# Patient Record
Sex: Female | Born: 1965 | Race: White | Hispanic: No | Marital: Married | Smoking: Current some day smoker
Health system: Southern US, Community
[De-identification: ages and names within clinical notes are randomized; demographics above are authoritative.]

## PROBLEM LIST (undated history)

## (undated) DIAGNOSIS — I10 Essential (primary) hypertension: Secondary | ICD-10-CM

## (undated) DIAGNOSIS — E119 Type 2 diabetes mellitus without complications: Secondary | ICD-10-CM

## (undated) DIAGNOSIS — M1990 Unspecified osteoarthritis, unspecified site: Secondary | ICD-10-CM

## (undated) DIAGNOSIS — K219 Gastro-esophageal reflux disease without esophagitis: Secondary | ICD-10-CM

## (undated) HISTORY — PX: COLONOSCOPY: SHX174

## (undated) HISTORY — PX: WISDOM TOOTH EXTRACTION: SHX21

## (undated) HISTORY — DX: Type 2 diabetes mellitus without complications: E11.9

## (undated) HISTORY — DX: Gastro-esophageal reflux disease without esophagitis: K21.9

## (undated) HISTORY — DX: Essential (primary) hypertension: I10

## (undated) HISTORY — DX: Unspecified osteoarthritis, unspecified site: M19.90

## (undated) HISTORY — PX: UPPER GASTROINTESTINAL ENDOSCOPY: SHX188

---

## 1997-11-18 ENCOUNTER — Encounter: Admission: RE | Admit: 1997-11-18 | Discharge: 1998-02-16 | Payer: Self-pay | Admitting: Gynecology

## 1998-03-05 ENCOUNTER — Inpatient Hospital Stay (HOSPITAL_COMMUNITY): Admission: AD | Admit: 1998-03-05 | Discharge: 1998-03-08 | Payer: Self-pay | Admitting: Gynecology

## 1998-03-09 ENCOUNTER — Encounter (HOSPITAL_COMMUNITY): Admission: RE | Admit: 1998-03-09 | Discharge: 1998-03-26 | Payer: Self-pay | Admitting: Gynecology

## 1998-04-13 ENCOUNTER — Other Ambulatory Visit: Admission: RE | Admit: 1998-04-13 | Discharge: 1998-04-13 | Payer: Self-pay | Admitting: Gynecology

## 1999-04-30 ENCOUNTER — Other Ambulatory Visit: Admission: RE | Admit: 1999-04-30 | Discharge: 1999-04-30 | Payer: Self-pay | Admitting: Gynecology

## 1999-09-11 ENCOUNTER — Ambulatory Visit (HOSPITAL_COMMUNITY): Admission: RE | Admit: 1999-09-11 | Discharge: 1999-09-11 | Payer: Self-pay | Admitting: Gynecology

## 1999-09-11 ENCOUNTER — Encounter (INDEPENDENT_AMBULATORY_CARE_PROVIDER_SITE_OTHER): Payer: Self-pay

## 2000-02-09 ENCOUNTER — Encounter: Admission: RE | Admit: 2000-02-09 | Discharge: 2000-05-09 | Payer: Self-pay | Admitting: Gynecology

## 2000-08-16 ENCOUNTER — Encounter (INDEPENDENT_AMBULATORY_CARE_PROVIDER_SITE_OTHER): Payer: Self-pay

## 2000-08-16 ENCOUNTER — Inpatient Hospital Stay (HOSPITAL_COMMUNITY): Admission: AD | Admit: 2000-08-16 | Discharge: 2000-08-19 | Payer: Self-pay | Admitting: Gynecology

## 2000-09-27 ENCOUNTER — Other Ambulatory Visit: Admission: RE | Admit: 2000-09-27 | Discharge: 2000-09-27 | Payer: Self-pay | Admitting: Gynecology

## 2001-10-01 ENCOUNTER — Other Ambulatory Visit: Admission: RE | Admit: 2001-10-01 | Discharge: 2001-10-01 | Payer: Self-pay | Admitting: Gynecology

## 2003-01-09 ENCOUNTER — Other Ambulatory Visit: Admission: RE | Admit: 2003-01-09 | Discharge: 2003-01-09 | Payer: Self-pay | Admitting: Gynecology

## 2004-05-19 ENCOUNTER — Other Ambulatory Visit: Admission: RE | Admit: 2004-05-19 | Discharge: 2004-05-19 | Payer: Self-pay | Admitting: Gynecology

## 2004-05-30 HISTORY — PX: ACHILLES TENDON REPAIR: SUR1153

## 2005-01-15 ENCOUNTER — Inpatient Hospital Stay (HOSPITAL_COMMUNITY): Admission: EM | Admit: 2005-01-15 | Discharge: 2005-01-15 | Payer: Self-pay | Admitting: Emergency Medicine

## 2005-06-02 ENCOUNTER — Other Ambulatory Visit: Admission: RE | Admit: 2005-06-02 | Discharge: 2005-06-02 | Payer: Self-pay | Admitting: Gynecology

## 2006-06-07 ENCOUNTER — Other Ambulatory Visit: Admission: RE | Admit: 2006-06-07 | Discharge: 2006-06-07 | Payer: Self-pay | Admitting: Gynecology

## 2007-06-13 ENCOUNTER — Emergency Department (HOSPITAL_COMMUNITY): Admission: EM | Admit: 2007-06-13 | Discharge: 2007-06-13 | Payer: Self-pay | Admitting: Emergency Medicine

## 2008-09-09 ENCOUNTER — Encounter: Payer: Self-pay | Admitting: Gynecology

## 2008-09-09 ENCOUNTER — Other Ambulatory Visit: Admission: RE | Admit: 2008-09-09 | Discharge: 2008-09-09 | Payer: Self-pay | Admitting: Gynecology

## 2008-09-09 ENCOUNTER — Ambulatory Visit: Payer: Self-pay | Admitting: Gynecology

## 2008-09-16 ENCOUNTER — Encounter: Admission: RE | Admit: 2008-09-16 | Discharge: 2008-09-16 | Payer: Self-pay | Admitting: Gynecology

## 2009-07-29 ENCOUNTER — Inpatient Hospital Stay (HOSPITAL_COMMUNITY): Admission: AD | Admit: 2009-07-29 | Discharge: 2009-07-29 | Payer: Self-pay | Admitting: Gynecology

## 2009-07-31 ENCOUNTER — Ambulatory Visit: Payer: Self-pay | Admitting: Gynecology

## 2009-08-07 ENCOUNTER — Ambulatory Visit: Payer: Self-pay | Admitting: Gynecology

## 2009-09-24 ENCOUNTER — Other Ambulatory Visit: Admission: RE | Admit: 2009-09-24 | Discharge: 2009-09-24 | Payer: Self-pay | Admitting: Gynecology

## 2009-09-24 ENCOUNTER — Ambulatory Visit: Payer: Self-pay | Admitting: Gynecology

## 2010-06-23 ENCOUNTER — Encounter (INDEPENDENT_AMBULATORY_CARE_PROVIDER_SITE_OTHER): Payer: Self-pay | Admitting: *Deleted

## 2010-07-01 NOTE — Letter (Signed)
Summary: New Patient letter  [facility]  [address] [place], [place] [address]   Phone: [phone]  Fax: [phone]       06/23/2010 MRN: [id number]  [name] [address] [place], [place]  [address]  Dear Ms. [facility],  Welcome to the Gastroenterology Division at [facility].    You are scheduled to see Dr.  Rob Bunting on July 16, 2010 at 3:15pm on the 3rd floor at [facility], [address]  We ask that you try to arrive at our office 15 minutes prior to your appointment time to allow for check-in.  We would like you to complete the enclosed self-administered evaluation form prior to your visit and bring it with you on the day of your appointment.  We will review it with you.  Also, please bring a complete list of all your medications or, if you prefer, bring the medication bottles and we will list them.  Please bring your insurance card so that we may make a copy of it.  If your insurance requires a referral to see a specialist, please bring your referral form from your primary care physician.  Co-payments are due at the time of your visit and may be paid by cash, check or credit card.     Your office visit will consist of a consult with your physician (includes a physical exam), any laboratory testing he/she may order, scheduling of any necessary diagnostic testing (e.g. x-ray, ultrasound, CT-scan), and scheduling of a procedure (e.g. Endoscopy, Colonoscopy) if required.  Please allow enough time on your schedule to allow for any/all of these possibilities.    If you cannot keep your appointment, please call [phone] to cancel or reschedule prior to your appointment date.  This allows [place] the opportunity to schedule an appointment for another patient in need of care.  If you do not cancel or reschedule by 5 p.m. the business day prior to your appointment date, you will be charged a $50.00 late cancellation/no-show fee.    Thank you for  choosing [facility] for your medical needs.  We appreciate the opportunity to care for you.  Please visit [place] at our website  to learn more about our practice.                     Sincerely,                                                             The Gastroenterology Division

## 2010-07-16 ENCOUNTER — Ambulatory Visit (INDEPENDENT_AMBULATORY_CARE_PROVIDER_SITE_OTHER): Payer: BC Managed Care – PPO | Admitting: Gastroenterology

## 2010-07-16 ENCOUNTER — Other Ambulatory Visit: Payer: Self-pay | Admitting: Gastroenterology

## 2010-07-16 ENCOUNTER — Other Ambulatory Visit: Payer: BC Managed Care – PPO

## 2010-07-16 ENCOUNTER — Encounter: Payer: Self-pay | Admitting: Gastroenterology

## 2010-07-16 ENCOUNTER — Encounter (INDEPENDENT_AMBULATORY_CARE_PROVIDER_SITE_OTHER): Payer: Self-pay | Admitting: *Deleted

## 2010-07-16 DIAGNOSIS — R112 Nausea with vomiting, unspecified: Secondary | ICD-10-CM

## 2010-07-16 DIAGNOSIS — R1013 Epigastric pain: Secondary | ICD-10-CM

## 2010-07-16 DIAGNOSIS — K3189 Other diseases of stomach and duodenum: Secondary | ICD-10-CM

## 2010-07-16 LAB — COMPREHENSIVE METABOLIC PANEL
ALT: 20 U/L (ref 0–35)
AST: 17 U/L (ref 0–37)
BUN: 11 mg/dL (ref 6–23)
Creatinine, Ser: 0.9 mg/dL (ref 0.4–1.2)
Sodium: 140 mEq/L (ref 135–145)
Total Bilirubin: 0.6 mg/dL (ref 0.3–1.2)
Total Protein: 7 g/dL (ref 6.0–8.3)

## 2010-07-21 ENCOUNTER — Telehealth (INDEPENDENT_AMBULATORY_CARE_PROVIDER_SITE_OTHER): Payer: Self-pay | Admitting: *Deleted

## 2010-07-21 NOTE — Letter (Signed)
Summary: EGD Instructions  [facility]  [address] [place], [place] [address]   Phone: [phone]  Fax: [phone]       [name]    [birth year]    MRN: [id number]       Procedure Day /Date:08/11/10 WED     Arrival Time: 230 pm     Procedure Time:330 pm     Location of Procedure:                    [facility] (4th Floor)    PREPARATION FOR ENDOSCOPY   On 08/11/10  THE DAY OF THE PROCEDURE:  1.   No solid foods, milk or milk products are allowed after midnight the night before your procedure.  2.   Do not drink anything colored red or purple.  Avoid juices with pulp.  No orange juice.  3.  You may drink clear liquids until 130 pm, which is 2 hours before your procedure.                                                                                                CLEAR LIQUIDS INCLUDE: Water Jello Ice Popsicles Tea (sugar ok, no milk/cream) Powdered fruit flavored drinks Coffee (sugar ok, no milk/cream) Gatorade Juice: apple, white grape, white cranberry  Lemonade Clear bullion, consomm, broth Carbonated beverages (any kind) Strained chicken noodle soup Hard Candy   MEDICATION INSTRUCTIONS  Unless otherwise instructed, you should take regular prescription medications with a small sip of water as early as possible the morning of your procedure.             OTHER INSTRUCTIONS  You will need a responsible adult at least 43 years of age to accompany you and drive you home.   This person must remain in the waiting room during your procedure.  Wear loose fitting clothing that is easily removed.  Leave jewelry and other valuables at home.  However, you may wish to bring a book to read or an iPod/MP3 player to listen to music as you wait for your procedure to start.  Remove all body piercing jewelry and leave at home.  Total time from sign-in until discharge is approximately 2-3 hours.  You should go home directly after  your procedure and rest.  You can resume normal activities the day after your procedure.  The day of your procedure you should not:   Drive   Make legal decisions   Operate machinery   Drink alcohol   Return to work  You will receive specific instructions about eating, activities and medications before you leave.    The above instructions have been reviewed and explained to me by   _______________________    I fully understand and can verbalize these instructions _____________________________ Date _________

## 2010-07-21 NOTE — Assessment & Plan Note (Addendum)
History of Present Illness Visit Type: Initial Visit Primary GI MD: Rob Bunting MD Primary Provider: n/a Chief Complaint: weight loss & GERD History of Present Illness:        a very pleasant 45 year old woman who is here with her husband today.   she was having GERD symptoma in 1993 (was on propulcid, prevacid, pepcid daily).   and now they are back.  She has a lot of nausea, vomiting.  3-4 times in a week.  usually 20 min after eating.  has been going on for about 4 months.  no pain, symptoms always post prandial.  Feels acid in stomach.    She tried prilosec daily for 14 day, without any difference.    No other meds this.  Currently likes to drink beer at night, late in evening.  this is not new.     no real NSAIDs.  she has lost 23 pounds in past 3 months.             Current Medications (verified): 1)  None  Allergies (verified): No Known Drug Allergies  Past History:  Past Medical History:  none  Past Surgical History:  none  Family History:  no colon cancer, no esophageal cancer  Social History:  she is married, she has one son, she works as a Production assistant, radio at [employer] , she drinks approximately 2-3 beers nightly, she does not smoke cigarettes.  Review of Systems       Pertinent positive and negative review of systems were noted in the above HPI and GI specific review of systems.  All other review of systems was otherwise negative.   Vital Signs:  Patient profile:   45 year old female Height:      68 inches Weight:      189.50 pounds BMI:     28.92 Pulse rate:   72 / minute Pulse rhythm:   regular BP sitting:   118 / 76  (left arm) Cuff size:   regular  Vitals Entered By: June McMurray CMA Duncan Dull) (July 16, 2010 3:26 PM)  Physical Exam  Additional Exam:  Constitutional: generally well appearing Psychiatric: alert and oriented times 3 Eyes: extraocular movements intact Mouth: oropharynx moist, no lesions Neck: supple, no  lymphadenopathy Cardiovascular: heart regular rate and rythm Lungs: CTA bilaterally Abdomen: soft, non-tender, non-distended, no obvious ascites, no peritoneal signs, normal bowel sounds Extremities: no lower extremity edema bilaterally Skin: no lesions on visible extremities    Impression & Recommendations:  Problem # 1:   Nausea, vomiting, dyspepsia  her symptoms may indeed be GERD related however she does not have classic GERD symptoms of heartburn, acid taste in the mouth. She also had no response to proton pump inhibitor for 2 weeks. I think we should proceed with EGD to check for other causes of her symptoms such as gastritis, infection, peptic ulcer disease. She'll get a basic set of labs today CBC and complete metabolic profile. I have given her samples of the prescription proton pump inhibitor which she will take nightly before dinner. If the above workup is not helpful then we will proceed with ultrasound to check  for possible biliary causes.  Other Orders: TLB-CBC Platelet - w/Differential (85025-CBCD) TLB-CMP (Comprehensive Metabolic Pnl) (80053-COMP) T-Pregnancy Test, Urine, Qual ([address])  Patient Instructions: 1)  You will be scheduled to have an upper endoscopy.  May need ultrasound depending on EGD results. 2)  Nexium trial (samples given).  take 1 pill 20-30 min prior dinner  meal. 3)  You will get lab test(s) done today (cbc, cmet, urine preg). 4)  The medication list was reviewed and reconciled.  All changed / newly prescribed medications were explained.  A complete medication list was provided to the patient / caregiver.  Appended Document: Orders Update/EGD nexium samples given   Clinical Lists Changes  Orders: Added new Test order of EGD (EGD) - Signed      Appended Document: Orders Update    Clinical Lists Changes  Orders: Added new Test order of TLB-Preg Serum Quant (B-hCG) (84702-HCG-QN) - Signed

## 2010-07-23 LAB — CONVERTED CEMR LAB
Eosinophils Relative: 1 % (ref 0–5)
Lymphocytes Relative: 30 % (ref 12–46)
Lymphs Abs: 2.2 10*3/uL (ref 0.7–4.0)
Monocytes Absolute: 0.5 10*3/uL (ref 0.1–1.0)
Neutro Abs: 4.4 10*3/uL (ref 1.7–7.7)
Platelets: 263 10*3/uL (ref 150–400)

## 2010-07-27 NOTE — Progress Notes (Signed)
Summary: Lab error  Phone Note Other Incoming   Summary of Call: Dr Christella Hartigan the lab just called and advised me that they failed to have the pt collect a urine sample for the pregnancy test.  They have called her back to have this done. Initial call taken by: Chales Abrahams CMA Duncan Dull),  July 21, 2010 11:08 AM  Follow-up for Phone Call        i would like her to have whichever preg test is fastest...they said it was a 3-5 day turnaround for the urine test previously...that doesn't seem right but if it is, then blood preg test.  she's, we've waited long enough Follow-up by: Rachael Fee MD,  July 21, 2010 11:59 AM  Additional Follow-up for Phone Call Additional follow up Details #1::        Edson Snowball said she can do the blood pregnancy in our lab and run it stat.  The pt is aware and will be in late this afternoon.  The urine test was cancelled Additional Follow-up by: Chales Abrahams CMA Duncan Dull),  July 21, 2010 1:49 PM    Additional Follow-up for Phone Call Additional follow up Details #2::    thanks Follow-up by: Rachael Fee MD,  July 21, 2010 4:32 PM

## 2010-08-04 ENCOUNTER — Encounter (INDEPENDENT_AMBULATORY_CARE_PROVIDER_SITE_OTHER): Payer: Self-pay | Admitting: *Deleted

## 2010-08-04 ENCOUNTER — Other Ambulatory Visit: Payer: BC Managed Care – PPO

## 2010-08-04 ENCOUNTER — Other Ambulatory Visit: Payer: Self-pay | Admitting: Gastroenterology

## 2010-08-04 LAB — PREGNANCY SERUM, QUANT: hCG, Beta Chain, Quant, S: 38815 m[IU]/mL

## 2010-08-11 ENCOUNTER — Ambulatory Visit (INDEPENDENT_AMBULATORY_CARE_PROVIDER_SITE_OTHER): Payer: BC Managed Care – PPO | Admitting: Gynecology

## 2010-08-11 ENCOUNTER — Other Ambulatory Visit: Payer: BC Managed Care – PPO | Admitting: Gastroenterology

## 2010-08-11 DIAGNOSIS — R82998 Other abnormal findings in urine: Secondary | ICD-10-CM

## 2010-08-11 DIAGNOSIS — N912 Amenorrhea, unspecified: Secondary | ICD-10-CM

## 2010-08-16 ENCOUNTER — Ambulatory Visit (INDEPENDENT_AMBULATORY_CARE_PROVIDER_SITE_OTHER): Payer: BC Managed Care – PPO | Admitting: Gynecology

## 2010-08-16 ENCOUNTER — Other Ambulatory Visit: Payer: BC Managed Care – PPO

## 2010-08-16 DIAGNOSIS — N912 Amenorrhea, unspecified: Secondary | ICD-10-CM

## 2010-08-16 DIAGNOSIS — O9989 Other specified diseases and conditions complicating pregnancy, childbirth and the puerperium: Secondary | ICD-10-CM

## 2010-08-20 LAB — CBC
HCT: 39.2 % (ref 36.0–46.0)
Hemoglobin: 13.5 g/dL (ref 12.0–15.0)
MCHC: 34.5 g/dL (ref 30.0–36.0)
MCV: 94.8 fL (ref 78.0–100.0)
Platelets: 252 10*3/uL (ref 150–400)
RDW: 13.3 % (ref 11.5–15.5)

## 2010-08-20 LAB — WET PREP, GENITAL
Trich, Wet Prep: NONE SEEN
Yeast Wet Prep HPF POC: NONE SEEN

## 2010-08-20 LAB — HCG, QUANTITATIVE, PREGNANCY: hCG, Beta Chain, Quant, S: 4766 m[IU]/mL — ABNORMAL HIGH (ref <5)

## 2010-08-20 LAB — POCT PREGNANCY, URINE
Preg Test, Ur: POSITIVE
Preg Test, Ur: POSITIVE

## 2010-09-15 ENCOUNTER — Ambulatory Visit (INDEPENDENT_AMBULATORY_CARE_PROVIDER_SITE_OTHER): Payer: BC Managed Care – PPO | Admitting: Gynecology

## 2010-09-15 DIAGNOSIS — Z3049 Encounter for surveillance of other contraceptives: Secondary | ICD-10-CM

## 2010-10-05 ENCOUNTER — Encounter: Payer: Self-pay | Admitting: Gastroenterology

## 2010-10-06 ENCOUNTER — Ambulatory Visit (AMBULATORY_SURGERY_CENTER): Payer: BC Managed Care – PPO | Admitting: Gastroenterology

## 2010-10-06 ENCOUNTER — Encounter: Payer: Self-pay | Admitting: Gastroenterology

## 2010-10-06 VITALS — BP 135/93 | HR 75 | Temp 97.9°F | Resp 18 | Ht 68.0 in | Wt 180.0 lb

## 2010-10-06 DIAGNOSIS — K219 Gastro-esophageal reflux disease without esophagitis: Secondary | ICD-10-CM

## 2010-10-06 DIAGNOSIS — R112 Nausea with vomiting, unspecified: Secondary | ICD-10-CM

## 2010-10-06 MED ORDER — SODIUM CHLORIDE 0.9 % IV SOLN: 500.0000 mL | INTRAVENOUS | Status: DC

## 2010-10-06 MED ORDER — ESOMEPRAZOLE MAGNESIUM 40 MG PO CPDR: 40.0000 mg | DELAYED_RELEASE_CAPSULE | Freq: Every day | ORAL | Status: DC

## 2010-10-06 NOTE — Patient Instructions (Signed)
Follow discharge instructions.  Continue your medications.  New prescription for Nexium.

## 2010-10-07 ENCOUNTER — Telehealth: Payer: Self-pay | Admitting: *Deleted

## 2010-10-07 NOTE — Telephone Encounter (Signed)

## 2010-10-15 NOTE — Op Note (Signed)
[facility]  Patient:    [name], [name]                       MRN: [id number] Proc. Date: 08/16/00 Adm. Date:  [id number] Attending:  Douglass Rivers                           Operative Report  PREOPERATIVE DIAGNOSES:       1. Previous cesarean section.                               2. ______ diabetes.  POSTOPERATIVE DIAGNOSES:      1. Previous cesarean section.                               2. ______ diabetes.                               3. Pelvic adhesions.  PROCEDURE:                    Elective repeat cesarean section, low flap transverse, with partial omentectomy.  SURGEON:                      Douglass Rivers, M.D.  ASSISTANT:                    Gaetano Hawthorne. Lily Peer, M.D.  ANESTHESIA:                   Spinal.  ESTIMATED BLOOD LOSS:         500 cc.  FINDINGS:                     Viable female infant in the vertex presentation. Clear amniotic fluid.  Loose nuchal cord.  Apgars 9 and 9.  Birth weight 8 lb 1 oz.  Normal uterus, tubes and ovaries.  There were extensive omental adhesions.  PATHOLOGY:                    Omental segment.  COMPLICATIONS:                None.  DESCRIPTION OF PROCEDURE:     The patient was taken to the operating room and placed in the supine position after spinal anesthesia, with left lateral displacement.  She was prepped and draped in the usual sterile fashion.  After adequate anesthesia was ensured, a Pfannenstiel skin incision was made with a scalpel going through the previous cesarean section scar.  The incision was carried through to the underlying layer of fascia with electrocautery.  The fascia was scored in the midline and extended laterally, again with electrocautery.  The fascial incision was grasped with Kochers.  The underlying rectus muscles were dissected off by sharp dissection secondary to extensive adhesion.  The superior aspect of the fascial incision was also grasped with Kochers and the  underlying rectus muscles were dissected off. The rectus muscles were separated in the midline prior to the procedure, but there was an extensive amount of adhesions that were noted.  The abdominal cavity was entered sharply.  A finger was placed in the cavity and there was noted to be extensive omental adhesions to  the anterior abdominal wall.  The incision was carried through with careful attention to the underlying omentum. The pyramidalis muscle was separated, with careful attention to underlying bladder.  The bladder was markedly scarred to the rectus muscle, and this was sharply dissected off.  The omentum was able to be pushed out of the operative field.  The vesicouterine peritoneum was identified.  A bladder blade was inserted.  It was tented up and entered sharply with Metzenbaums.  A bladder flap was created digitally.  The bladder blade was then reinserted and the lower uterine segment was incised n a transverse fashion with a scalpel.  A copious amount of clear amniotic fluid was noted upon entry into the cavity. The vertex was floating and was stabilized, and a vacuum was used to prevent the vertex from converting to breech.  The infant was gently guided through the uterine incision, with careful attention to not extend the incision.  The infant was delivered with the usual maneuvers.  The cord was clamped and cut and the infant was handed off to the waiting pediatricians.  Cord bloods were obtained.  The uterus was massaged and the placenta was allowed to separate naturally.  The uterus was cleared of all clots and debris.  The uterine incision was then repaired with a running locked layer of 0 chromic.  The adnexa were inspected and were noted to be normal.  There was a small paratubal cyst on the left tube.  The pelvis was irrigated with copious amounts of warm saline.  Reinspection of the incision ensured [place] of hemostasis.  The omentum was inspected and initially felt to be  hemostatic, as was the peritoneum and muscles.  We began to close the fascia.  However, it was felt that perhaps there was some bleeding coming from the omentum.  The suture was cut and the omentum was inspected.  Closer inspection of the omentum showed that there were some areas of bleeding as well as two rents which would possibly allow herniation of bowel.  The areas of omentum were then sharply dissected off after being cross clamped with Kelly clamps and free ties of 0 Vicryl were placed.  A small portion of the omentum ended up being excised, and this was passed off the table.  Reinspection of the remainder of the omentum ensured [place] of hemostasis and, again, the peritoneum and muscles were felt to be hemostatic.  The fascia was then closed with 0 Vicryl, starting at the apex and running towards the midline bilaterally.  The subcu was irrigated.  The skin was closed with staples.  The patient tolerated the procedure well.  Sponge, lap, and needle counts were correct x 2.  She was transferred to the PACU in stable condition. DD:  08/16/00 TD:  08/17/00 Job: [id number]

## 2010-10-15 NOTE — Op Note (Signed)
[facility]  Patient:    [name], [name]                       MRN: [id number] Proc. Date: 09/11/99 Adm. Date:  [id number] Attending:  Douglass Rivers                           Operative Report  PREOPERATIVE DIAGNOSIS:       Missed abortion at 9 weeks.  POSTOPERATIVE DIAGNOSIS:      Missed abortion at 9 weeks.  PROCEDURE:                    First trimester dilatation and evacuation.  SURGEON:                      Douglass Rivers, M.D.  ANESTHESIA:                   MAC with paracervical block.  ESTIMATED BLOOD LOSS:         Minimal.  FINDINGS:                     Normal cavity.  PATHOLOGY:                    Uterine contents.  DESCRIPTION OF PROCEDURE:     Patient was taken to the operating room, IV sedation was induced, placed in the dorsal lithotomy position and prepped and draped in he usual sterile fashion.  A bivalved speculum was placed in the vagina and the cervix was visualized and a paracervical block was placed.  The bivalve was removed. Orientation in the uterus was confirmed.  The sterile weighted speculum was placed in the vagina.  The cervix was stabilized with a single-tooth tenaculum and dilated to 27-French.  A #8 suction curette was then advanced through the cervix towards the fundus; suction was obtained.  The uterus was cleared of its contents.  A gentle sharp curettage afterwards confirmed good uterine cry throughout and a normal cavity.  The instruments were removed from the vagina.  The patient tolerated the procedure well.  Sponge, lap and needle counts were correct.  She was given IM Methergine postoperatively and transferred to the PACU in stable condition.DD:  09/11/99 TD:  09/11/99 Job: [id number]

## 2010-10-15 NOTE — Discharge Summary (Signed)
[facility]  Patient:    [name], [name]                       MRN: [id number] Adm. Date:  [id number] Disc. Date: [id number] Attending:  Douglass Rivers Dictator:   Antony Contras, [facility]                           Discharge Summary  DISCHARGE DIAGNOSES:          1. Intrauterine pregnancy at term.                               2. History of previous cesarean section.                               3. Gestational diabetes requiring insulin                                  management.  PROCEDURES:                   Elective repeat left cervical transverse cesarean section with partial omentectomy and delivery of viable infant.  HISTORY OF PRESENT ILLNESS:   The patient is a 45 year old gravida 5, para 1, O, 3, 1, with LMP November 18, 1999, [facility] of August 22, 2000.  Pregnancy was complicated by gestational diabetes requiring insulin management.  The patient also had a history of previous cesarean section and desired repeat.  LABORATORY DATA:              Blood type A positive, AB positive, antibody screen negative, RPR, ______ nonreactive, rubella immune, MSAFP normal.  HOSPITAL COURSE:              Patient was admitted for repeat cesarean section, which was performed by Dr. Farrel Gobble and Dr. Lily Peer under spinal anesthesia.  The patient was delivered of a viable female infant, Apgars 9/9, weighing 8 pounds 1 ounce.  Pathology revealed no abnormal uterus, tubes, ovaries with omental effusions, partial omentectomy was performed. Postoperatively, the patient remained afebrile, had no difficulty voiding, was able to be discharged on her second postoperative day in satisfactory condition.  CBC and hematocrit 33.3, hemoglobin 12, WBCs 14.6, platelets 200.  DISCHARGE DISPOSITION:        Followup in six weeks.  Continue prenatal vitamins and iron, Tylox for pain. DD:  09/11/00 TD:  09/11/00 Job: [id number]

## 2010-10-15 NOTE — Op Note (Signed)
[name], [name] NO.:  [id number]   MEDICAL RECORD NO.:  [id number]          PATIENT TYPE:  INP   LOCATION:  1826                         FACILITY:  [facility]   PHYSICIAN:  Nadara Mustard, MD     DATE OF BIRTH:  1965-10-08   DATE OF PROCEDURE:  01/15/2005  DATE OF DISCHARGE:                                 OPERATIVE REPORT   PREOPERATIVE DIAGNOSIS:  Left Achilles tendon rupture.   POSTOPERATIVE DIAGNOSIS:  Left Achilles tendon rupture.   SURGEON.:  Nadara Mustard, MD   ANESTHESIA:  Popliteal block.   ESTIMATED BLOOD LOSS:  None.   ANTIBIOTICS:  Kefzol 1 g.   TOURNIQUET TIME:  Esmarch at the calf approximately 19 minutes.   DISPOSITION:  To PACU in stable condition. Plan for discharge to home.  Follow-up in the office in 2 weeks, nonweightbearing, prescription for Tylox  and Vicodin.   CONSENT:  The risks and benefits of surgery were discussed with the patient  and her husband including infection, neurovascular injury, persistent pain,  weakness free rupture of the tendon, nonhealing wound, and need for  additional surgery. The patient and her husband been state they understand  and wished to proceed at this time.   INDICATIONS FOR PROCEDURE:  The patient is a 45 year old woman who was  playing volleyball.  She went to push off her left foot, she felt like  somebody had kicked her in the back of the ankle. She was evaluated in the  emergency room and she was seen for evaluation and consultation for  evaluation of her Achilles tendon rupture. The patient's past medical  history is essentially unremarkable. No medications. No allergies.  Review  of systems was negative for any pulmonary or cardiac problems. Negative  history of diabetes. She states she did have gestational diabetes.   On examination the patient had good dorsalis pedis pulse. Her foot was  neurovascularly intact; it was a closed injury.  She had a palpable defect  of the Achilles.   Compression the calf did not reproduce plantar flexion.   ASSESSMENT:  Achilles tendon rupture.   PLAN:  The patient is scheduled for surgical intervention at this time,  after reviewing the risks and benefits.   DESCRIPTION OF PROCEDURE:  The patient was brought to the OR 5 after  undergoing a popliteal block. After adequate level of anesthesia obtained,  the patient's left lower extremity was prepped using DuraPrep, draped in a  sterile field. Wyban was used to cover all exposed skin. She was placed in  the prone position. An  Esmarch was wrapped around the ankle for tourniquet  control. A posterior medial incision was made; this was carried down sharply  through the peritenon. The patient had complete rupture of mid substance  closer to the musculotendinous junction of the Achilles. Using 4 of the #2  fiber wire sutures, these were passed 2 of the sutures proximally and 2  sutures distally using a Krakower suture technique. This left 4 sutures from  each of the proximal and distal stumps. The rupture was reduced and  the  sutures were tied in mid substance. The patient had a good range of motion  after repair. The paratenon was closed using 3-0 Monocryl.  The skin was  closed using a Algower suture using 3-0 nylon. The wound was covered with  Adaptic orthopedic sponges, Webril and a Coban dressing. The patient's foot  was left in plantar flexion.  She will be discharged to home,  nonweightbearing; follow-up in the office in 2 weeks for placement of the  fracture boot to begin weightbearing.      Nadara Mustard, MD  Electronically Signed     MVD/MEDQ  D:  01/15/2005  T:  01/16/2005  Job:  [id number]

## 2013-05-02 ENCOUNTER — Ambulatory Visit (INDEPENDENT_AMBULATORY_CARE_PROVIDER_SITE_OTHER): Payer: BC Managed Care – PPO | Admitting: Gynecology

## 2013-05-02 ENCOUNTER — Encounter: Payer: Self-pay | Admitting: Gynecology

## 2013-05-02 ENCOUNTER — Other Ambulatory Visit (HOSPITAL_COMMUNITY)
Admission: RE | Admit: 2013-05-02 | Discharge: 2013-05-02 | Disposition: A | Discharge status: Home / Self Care | Payer: BC Managed Care – PPO | Source: Ambulatory Visit | Attending: Gynecology | Admitting: Gynecology

## 2013-05-02 ENCOUNTER — Other Ambulatory Visit: Payer: Self-pay

## 2013-05-02 VITALS — BP 132/86 | Ht 67.75 in | Wt 198.6 lb

## 2013-05-02 DIAGNOSIS — Z8632 Personal history of gestational diabetes: Secondary | ICD-10-CM

## 2013-05-02 DIAGNOSIS — Z1151 Encounter for screening for human papillomavirus (HPV): Secondary | ICD-10-CM | POA: Insufficient documentation

## 2013-05-02 DIAGNOSIS — Z01419 Encounter for gynecological examination (general) (routine) without abnormal findings: Secondary | ICD-10-CM | POA: Insufficient documentation

## 2013-05-02 DIAGNOSIS — R87810 Cervical high risk human papillomavirus (HPV) DNA test positive: Secondary | ICD-10-CM | POA: Insufficient documentation

## 2013-05-02 DIAGNOSIS — R635 Abnormal weight gain: Secondary | ICD-10-CM

## 2013-05-02 DIAGNOSIS — G4700 Insomnia, unspecified: Secondary | ICD-10-CM

## 2013-05-02 DIAGNOSIS — Z1231 Encounter for screening mammogram for malignant neoplasm of breast: Secondary | ICD-10-CM

## 2013-05-02 LAB — COMPREHENSIVE METABOLIC PANEL
Alkaline Phosphatase: 55 U/L (ref 39–117)
BUN: 9 mg/dL (ref 6–23)
CO2: 28 mEq/L (ref 19–32)
Glucose, Bld: 88 mg/dL (ref 70–99)
Total Bilirubin: 0.5 mg/dL (ref 0.3–1.2)

## 2013-05-02 LAB — CBC WITH DIFFERENTIAL/PLATELET
Basophils Absolute: 0 10*3/uL (ref 0.0–0.1)
Basophils Relative: 1 % (ref 0–1)
Eosinophils Absolute: 0.1 10*3/uL (ref 0.0–0.7)
Eosinophils Relative: 3 % (ref 0–5)
Lymphocytes Relative: 31 % (ref 12–46)
MCH: 32 pg (ref 26.0–34.0)
MCHC: 35.6 g/dL (ref 30.0–36.0)
Monocytes Absolute: 0.4 10*3/uL (ref 0.1–1.0)
Monocytes Relative: 7 % (ref 3–12)
RBC: 4.94 MIL/uL (ref 3.87–5.11)
RDW: 12.9 % (ref 11.5–15.5)
WBC: 4.9 10*3/uL (ref 4.0–10.5)

## 2013-05-02 LAB — LIPID PANEL
Cholesterol: 166 mg/dL (ref 0–200)
HDL: 58 mg/dL (ref >39)
LDL Cholesterol: 89 mg/dL (ref 0–99)
Triglycerides: 95 mg/dL (ref <150)

## 2013-05-02 LAB — TSH: TSH: 1.715 u[IU]/mL (ref 0.350–4.500)

## 2013-05-02 NOTE — Patient Instructions (Signed)

## 2013-05-02 NOTE — Progress Notes (Addendum)
[name] [birth year] [id number]   History:    47 y.o.  for annual gyn exam who has not been seen in the office since 2011.she reports normal menstrual cycles and is using condoms for contraception. 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 Patient does have history of gestational diabetes with her pregnancy in the past. She has not had a mammogram since April 2010 but she does do her monthly breast exam. Her gastroenterologist had been evaluating her in 2012 for reflux and had a normal EGD and colonoscopy according to patient. She is feeling better now. Patient refused the flu vaccine.  Past medical history,surgical history, family history and social history were all reviewed and documented in the EPIC chart.  Gynecologic History Patient's last menstrual period was 04/25/2013. Contraception: condoms Last Pap: 2011. Results were: normal Last mammogram: 2010. Results were: normal but dense  Obstetric History OB History  Gravida Para Term Preterm AB SAB TAB Ectopic Multiple Living  6 2   2 2    2     # Outcome Date GA Lbr Len/2nd Weight Sex Delivery Anes PTL Lv  6 SAB           5 SAB           4 PAR           3 PAR           2 GRA           1 GRA                ROS: A ROS was performed and pertinent positives and negatives are included in the history.  GENERAL: No fevers or chills. HEENT: No change in vision, no earache, sore throat or sinus congestion. NECK: No pain or stiffness. CARDIOVASCULAR: No chest pain or pressure. No palpitations. PULMONARY: No shortness of breath, cough or wheeze. GASTROINTESTINAL: No abdominal pain, nausea, vomiting or diarrhea, melena or bright red blood per rectum. GENITOURINARY: No urinary frequency, urgency, hesitancy or dysuria. MUSCULOSKELETAL: No joint or muscle pain, no back pain, no recent trauma. DERMATOLOGIC: No  rash, no itching, no lesions. ENDOCRINE: No polyuria, polydipsia, no heat or cold intolerance. No recent change in weight. HEMATOLOGICAL: No anemia or easy bruising or bleeding. NEUROLOGIC: No headache, seizures, numbness, tingling or weakness. PSYCHIATRIC: No depression, no loss of interest in normal activity or change in sleep pattern.     Exam: chaperone present  BP 132/86  Ht 5' 7.75" (1.721 m)  Wt 198 lb 9.6 oz (90.084 kg)  BMI 30.41 kg/m2  LMP 04/25/2013  Body mass index is 30.41 kg/(m^2).  General appearance : Well developed well nourished female. No acute distress HEENT: Neck supple, trachea midline, no carotid bruits, no thyroidmegaly Lungs: Clear to auscultation, no rhonchi or wheezes, or rib retractions  Heart: Regular rate and rhythm, no murmurs or gallops Breast:Examined in sitting and supine position were symmetrical in appearance, no palpable masses or tenderness,  no skin retraction, no nipple inversion, no nipple discharge, no skin discoloration, no axillary or supraclavicular lymphadenopathy Abdomen: no palpable masses or tenderness, no rebound or guarding Extremities: no edema or skin discoloration or tenderness  Pelvic:  Bartholin, Urethra, Skene Glands: Within normal limits             Vagina: No gross lesions or discharge  Cervix: No  gross lesions or discharge  Uterus  anteverted, normal size, shape and consistency, non-tender and mobile  Adnexa  Without masses or tenderness  Anus and perineum  normal   Rectovaginal  normal sphincter tone without palpated masses or tenderness             Hemoccult not indicated     Assessment/Plan:  48 y.o. female for annual exam long overdue. Pap smear with HPV screen was done today. She was given a requisition to schedule her mammogram. She was reminded her monthly breast exam. We discussed importance of calcium and vitamin D in regular exercise for osteoporosis prevention. Contraceptive options were discussed and she would  like to continue on a condom. The following labs were ordered: TSH, fasting lipid profile, comprehensive metabolic panel, CBC, and urinalysis.  Note: This dictation was prepared with  Dragon/digital dictation along withSmart phrase technology. Any transcriptional errors that result from this process are unintentional.   Ok Edwards MD, 12:39 PM 05/02/2013

## 2013-05-03 LAB — URINALYSIS W MICROSCOPIC + REFLEX CULTURE
Bilirubin Urine: NEGATIVE
Casts: NONE SEEN
Crystals: NONE SEEN
Glucose, UA: NEGATIVE mg/dL
Ketones, ur: NEGATIVE mg/dL
Specific Gravity, Urine: 1.023 (ref 1.005–1.030)
Urobilinogen, UA: 0.2 mg/dL (ref 0.0–1.0)
pH: 8 (ref 5.0–8.0)

## 2013-05-20 ENCOUNTER — Telehealth: Payer: Self-pay

## 2013-05-20 ENCOUNTER — Encounter: Payer: Self-pay | Admitting: Gynecology

## 2013-05-20 NOTE — Telephone Encounter (Signed)
Left message labs were fine and that is why we have not called her. No follow up needed.  She had said she did not have notification number to be able toget on My Chart so I let her know that I had put an AVS with a sheet explaining My Chart that includes her notification number into the mail for her.

## 2013-05-20 NOTE — Telephone Encounter (Signed)
Patient called to check lab results from 05/02/13.  I noticed the info regarding HPV testing on bottom of pap smear. Do I need to tell her anything about that?

## 2013-05-20 NOTE — Telephone Encounter (Signed)
Her Pap smear was normal but the HPV screen was positive. When we had Demerol in the HPV screen the subtypes that are responsible for cervical cancer was negative. We do not need to do anything differently than followup with a Pap smear in one year.

## 2013-06-10 ENCOUNTER — Ambulatory Visit
Admission: RE | Admit: 2013-06-10 | Discharge: 2013-06-10 | Disposition: A | Discharge status: Home / Self Care | Payer: BC Managed Care – PPO | Source: Ambulatory Visit

## 2013-06-10 DIAGNOSIS — Z1231 Encounter for screening mammogram for malignant neoplasm of breast: Secondary | ICD-10-CM

## 2013-11-13 ENCOUNTER — Telehealth: Payer: Self-pay | Admitting: *Deleted

## 2013-11-13 NOTE — Telephone Encounter (Signed)
Pt left message in voicemail requesting referral for GI MD, I left on pt voicemail that OV is needed before we can send her to GI MD, the provider would have to determine if GI. MD needed.

## 2014-03-31 ENCOUNTER — Encounter: Payer: Self-pay | Admitting: Gynecology

## 2014-05-20 ENCOUNTER — Encounter: Payer: BC Managed Care – PPO | Admitting: Gynecology

## 2014-06-02 ENCOUNTER — Ambulatory Visit (INDEPENDENT_AMBULATORY_CARE_PROVIDER_SITE_OTHER): Payer: BC Managed Care – PPO | Admitting: Gynecology

## 2014-06-02 ENCOUNTER — Encounter: Payer: Self-pay | Admitting: Gynecology

## 2014-06-02 VITALS — BP 136/88 | Ht 67.75 in | Wt 203.0 lb

## 2014-06-02 DIAGNOSIS — Z01419 Encounter for gynecological examination (general) (routine) without abnormal findings: Secondary | ICD-10-CM

## 2014-06-02 DIAGNOSIS — Z23 Encounter for immunization: Secondary | ICD-10-CM

## 2014-06-02 DIAGNOSIS — R140 Abdominal distension (gaseous): Secondary | ICD-10-CM

## 2014-06-02 DIAGNOSIS — K5901 Slow transit constipation: Secondary | ICD-10-CM

## 2014-06-02 MED ORDER — LINACLOTIDE 145 MCG PO CAPS: 145.0000 ug | ORAL_CAPSULE | Freq: Every day | ORAL | Status: DC

## 2014-06-02 NOTE — Progress Notes (Signed)
[name] April 25, 1966 [id number]   History:    49 y.o.  who presented to the office for her annual exam but has been complaining of feeling bloated and constipated whereby she goes every 3-4 days. She had seen a primary care physician few months ago and had prescribed her MiraLAX but she does not take it on a regular basis. She denies any blood in her stools or any family history colorectal cancer. Patient reports having normal menstrual cycles and currently using condoms for contraception. 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 Her gastroenterologist had been evaluating her in 2012 for reflux and had a normal EGD and colonoscopy according to patient. Patient had declined the flu vaccine this year. Patient to receive the T that vaccine today.   Past medical history,surgical history, family history and social history were all reviewed and documented in the EPIC chart.  Gynecologic History Patient's last menstrual period was 05/18/2014. Contraception: condoms Last Pap: 2014. Results were: normal Last mammogram: 2015. Results were: Dense breasts had three-dimensional mammogram  Obstetric History OB History  Gravida Para Term Preterm AB SAB TAB Ectopic Multiple Living  6 2   2 2    2     # Outcome Date GA Lbr Len/2nd Weight Sex Delivery Anes PTL Lv  6 SAB           5 SAB           4 Para           3 Para           2 Gravida           1 Gravida                ROS: A ROS was performed and pertinent positives and negatives are included in the history.  GENERAL: No fevers or chills. HEENT: No change in vision, no earache, sore throat or sinus congestion. NECK: No pain or stiffness. CARDIOVASCULAR: No chest pain or pressure. No palpitations. PULMONARY: No shortness of breath, cough or wheeze.  GASTROINTESTINAL: No abdominal pain, nausea, vomiting or diarrhea, melena or bright red blood per rectum. GENITOURINARY: No urinary frequency, urgency, hesitancy or dysuria. MUSCULOSKELETAL: No joint or muscle pain, no back pain, no recent trauma. DERMATOLOGIC: No rash, no itching, no lesions. ENDOCRINE: No polyuria, polydipsia, no heat or cold intolerance. No recent change in weight. HEMATOLOGICAL: No anemia or easy bruising or bleeding. NEUROLOGIC: No headache, seizures, numbness, tingling or weakness. PSYCHIATRIC: No depression, no loss of interest in normal activity or change in sleep pattern.     Exam: chaperone present  BP 136/88 mmHg  Ht 5' 7.75" (1.721 m)  Wt 203 lb (92.08 kg)  BMI 31.09 kg/m2  LMP 05/18/2014  Body mass index is 31.09 kg/(m^2).  General appearance : Well developed well nourished female. No acute distress HEENT: Neck supple, trachea midline, no carotid bruits, no thyroidmegaly Lungs: Clear to auscultation, no rhonchi or wheezes, or rib retractions  Heart: Regular rate and rhythm, no murmurs or gallops Breast:Examined in sitting and supine position were symmetrical in appearance, no palpable masses or tenderness,  no skin retraction, no nipple inversion, no nipple discharge, no skin discoloration, no axillary or supraclavicular lymphadenopathy Abdomen: no palpable masses or tenderness, no rebound or guarding Extremities: no edema or skin  discoloration or tenderness  Pelvic:  Bartholin, Urethra, Skene Glands: Within normal limits             Vagina: No gross lesions or discharge  Cervix: No gross lesions or discharge  Uterus  anteverted, normal size, shape and consistency, non-tender and mobile  Adnexa  Without masses or tenderness  Anus and perineum  normal   Rectovaginal  normal sphincter tone without palpated masses or tenderness             Hemoccult not given     Assessment/Plan:  49 y.o. female for annual exam with chronic constipation and bloating. She  is going to return back to the office next week for pelvic ultrasound for better assessment of her ovaries. Patient has gaining 5 pounds is last year currently weighing 203 pounds. She is going to be prescribed Linzess 145 g capsule daily to help with her constipation. Literature information was provided. We discussed importance of regular exercise as well as increasing fluid intake and high fiber diet. The following labs ordered today: Fasting lipid profile, TSH, comprehensive metabolic panel, CBC and urinalysis. Pap smear was not done today in accordance to the new guidelines. We discussed importance of monthly breast exam. Patient to receive the Tdap vaccine today.   Terrance Mass MD, 2:49 PM 06/02/2014

## 2014-06-02 NOTE — Addendum Note (Signed)
Addended by: Thurnell Garbe A on: 06/02/2014 02:58 PM   Modules accepted: Orders, SmartSet

## 2014-06-02 NOTE — Patient Instructions (Addendum)
Tdap Vaccine (Tetanus, Diphtheria, Pertussis): What You Need to Know 1. Why get vaccinated? Tetanus, diphtheria and pertussis can be very serious diseases, even for adolescents and adults. Tdap vaccine can protect us from these diseases. TETANUS (Lockjaw) causes painful muscle tightening and stiffness, usually all over the body.  It can lead to tightening of muscles in the head and neck so you can't open your mouth, swallow, or sometimes even breathe. Tetanus kills about 1 out of 5 people who are infected. DIPHTHERIA can cause a thick coating to form in the back of the throat.  It can lead to breathing problems, paralysis, heart failure, and death. PERTUSSIS (Whooping Cough) causes severe coughing spells, which can cause difficulty breathing, vomiting and disturbed sleep.  It can also lead to weight loss, incontinence, and rib fractures. Up to 2 in 100 adolescents and 5 in 100 adults with pertussis are hospitalized or have complications, which could include pneumonia or death. These diseases are caused by bacteria. Diphtheria and pertussis are spread from person to person through coughing or sneezing. Tetanus enters the body through cuts, scratches, or wounds. Before vaccines, the [place] saw as many as 200,000 cases a year of diphtheria and pertussis, and hundreds of cases of tetanus. Since vaccination began, tetanus and diphtheria have dropped by about 99% and pertussis by about 80%. 2. Tdap vaccine Tdap vaccine can protect adolescents and adults from tetanus, diphtheria, and pertussis. One dose of Tdap is routinely given at age 11 or 12. People who did not get Tdap at that age should get it as soon as possible. Tdap is especially important for health care professionals and anyone having close contact with a baby younger than 12 months. Pregnant women should get a dose of Tdap during every pregnancy, to protect the newborn from pertussis. Infants are most at risk for severe, life-threatening  complications from pertussis. A similar vaccine, called Td, protects from tetanus and diphtheria, but not pertussis. A Td booster should be given every 10 years. Tdap may be given as one of these boosters if you have not already gotten a dose. Tdap may also be given after a severe cut or burn to prevent tetanus infection. Your doctor can give you more information. Tdap may safely be given at the same time as other vaccines. 3. Some people should not get this vaccine  If you ever had a life-threatening allergic reaction after a dose of any tetanus, diphtheria, or pertussis containing vaccine, OR if you have a severe allergy to any part of this vaccine, you should not get Tdap. Tell your doctor if you have any severe allergies.  If you had a coma, or long or multiple seizures within 7 days after a childhood dose of DTP or DTaP, you should not get Tdap, unless a cause other than the vaccine was found. You can still get Td.  Talk to your doctor if you:  have epilepsy or another nervous system problem,  had severe pain or swelling after any vaccine containing diphtheria, tetanus or pertussis,  ever had Guillain-Barr Syndrome (GBS),  aren't feeling well on the day the shot is scheduled. 4. Risks of a vaccine reaction With any medicine, including vaccines, there is a chance of side effects. These are usually mild and go away on their own, but serious reactions are also possible. Brief fainting spells can follow a vaccination, leading to injuries from falling. Sitting or lying down for about 15 minutes can help prevent these. Tell your doctor if you feel   dizzy or light-headed, or have vision changes or ringing in the ears. Mild problems following Tdap (Did not interfere with activities)  Pain where the shot was given (about 3 in 4 adolescents or 2 in 3 adults)  Redness or swelling where the shot was given (about 1 person in 5)  Mild fever of at least 100.12F (up to about 1 in 25 adolescents or  1 in 100 adults)  Headache (about 3 or 4 people in 10)  Tiredness (about 1 person in 3 or 4)  Nausea, vomiting, diarrhea, stomach ache (up to 1 in 4 adolescents or 1 in 10 adults)  Chills, body aches, sore joints, rash, swollen glands (uncommon) Moderate problems following Tdap (Interfered with activities, but did not require medical attention)  Pain where the shot was given (about 1 in 5 adolescents or 1 in 100 adults)  Redness or swelling where the shot was given (up to about 1 in 16 adolescents or 1 in 25 adults)  Fever over 102F (about 1 in 100 adolescents or 1 in 250 adults)  Headache (about 3 in 20 adolescents or 1 in 10 adults)  Nausea, vomiting, diarrhea, stomach ache (up to 1 or 3 people in 100)  Swelling of the entire arm where the shot was given (up to about 3 in 100). Severe problems following Tdap (Unable to perform usual activities; required medical attention)  Swelling, severe pain, bleeding and redness in the arm where the shot was given (rare). A severe allergic reaction could occur after any vaccine (estimated less than 1 in a million doses). 5. What if there is a serious reaction? What should I look for?  Look for anything that concerns you, such as signs of a severe allergic reaction, very high fever, or behavior changes. Signs of a severe allergic reaction can include hives, swelling of the face and throat, difficulty breathing, a fast heartbeat, dizziness, and weakness. These would start a few minutes to a few hours after the vaccination. What should I do?  If you think it is a severe allergic reaction or other emergency that can't wait, call 9-1-1 or get the person to the nearest hospital. Otherwise, call your doctor.  Afterward, the reaction should be reported to the "Vaccine Adverse Event Reporting System" (VAERS). Your doctor might file this report, or you can do it yourself through the VAERS web site at www.vaers.SamedayNews.es, or by calling  (864)128-1521. VAERS is only for reporting reactions. They do not give medical advice.  6. The National Vaccine Injury Compensation Program The [employer] Vaccine Injury Compensation Program (VICP) is a federal program that was created to compensate people who may have been injured by certain vaccines. Persons who believe they may have been injured by a vaccine can learn about the program and about filing a claim by calling 301-200-3545 or visiting the [place] website at GoldCloset.com.ee. 7. How can I learn more?  Ask your doctor.  Call your local or state health department.  Contact the Centers for Disease Control and Prevention (CDC):  Call (640)321-8776 or visit CDC's website at http://hunter.com/. CDC Tdap Vaccine VIS (10/06/11) Document Released: 11/15/2011 Document Revised: 09/30/2013 Document Reviewed: 08/28/2013 ExitCare Patient Information 2015 [place], [place]. This information is not intended to replace advice given to you by your health care provider. Make sure you discuss any questions you have with your health care provider. Linaclotide oral capsules What is this medicine? LINACLOTIDE (lin a KLOE tide) is used to treat irritable bowel syndrome (IBS) with constipation as the main  problem. It may also be used for relief of chronic constipation. This medicine may be used for other purposes; ask your health care provider or pharmacist if you have questions. COMMON BRAND NAME(S): Linzess What should I tell my health care provider before I take this medicine? They need to know if you have any of these conditions: -history of stool (fecal) impaction -now have diarrhea or have diarrhea often -other medical condition -stomach or intestinal disease, including bowel obstruction or abdominal adhesions -an unusual or allergic reaction to linaclotide, other medicines, foods, dyes, or preservatives -pregnant or trying to get pregnant -breast-feeding How should I use this  medicine? Take this medicine by mouth with a glass of water. Follow the directions on the prescription label. Do not cut, crush or chew this medicine. Take on an empty stomach, at least 30 minutes before your first meal of the day. Take your medicine at regular intervals. Do not take your medicine more often than directed. Do not stop taking except on your doctor's advice. A special MedGuide will be given to you by the pharmacist with each prescription and refill. Be sure to read this information carefully each time. Talk to your pediatrician regarding the use of this medicine in children. This medicine is not approved for use in children. Overdosage: If you think you've taken too much of this medicine contact a poison control center or emergency room at once. Overdosage: If you think you have taken too much of this medicine contact a poison control center or emergency room at once. NOTE: This medicine is only for you. Do not share this medicine with others. What if I miss a dose? If you miss a dose, just skip that dose. Wait until your next dose, and take only that dose. Do not take double or extra doses. What may interact with this medicine? -certain medicines for bowel problems or bladder incontinence (these can cause constipation) This list may not describe all possible interactions. Give your health care provider a list of all the medicines, herbs, non-prescription drugs, or dietary supplements you use. Also tell them if you smoke, drink alcohol, or use illegal drugs. Some items may interact with your medicine. What should I watch for while using this medicine? Visit your doctor for regular check ups. Tell your doctor if your symptoms do not get better or if they get worse. Diarrhea is a common side effect of this medicine. It often begins within 2 weeks of starting this medicine. Stop taking this medicine and call your doctor if you get severe diarrhea. Stop taking this medicine and call your  doctor or go to the nearest hospital emergency room right away if you develop unusual or severe stomach-area (abdominal) pain, especially if you also have bright red, bloody stools or black stools that look like tar. What side effects may I notice from receiving this medicine? Side effects that you should report to your doctor or health care professional as soon as possible: -allergic reactions like skin rash, itching or hives, swelling of the face, lips, or tongue -black, tarry stools -bloody or watery diarrhea -new or worsening stomach pain -severe or prolonged diarrhea Side effects that usually do not require medical attention (Report these to your doctor or health care professional if they continue or are bothersome.): -bloating -gas -loose stools This list may not describe all possible side effects. Call your doctor for medical advice about side effects. You may report side effects to FDA at 1-800-FDA-1088. Where should I keep my medicine?  Keep out of the reach of children. Store at room temperature between 20 and 25 degrees C (68 and 77 degrees F). Keep this medicine in the original container. Keep tightly closed in a dry place. Do not remove the desiccant packet from the bottle, it helps to protect your medicine from moisture. Throw away any unused medicine after the expiration date. NOTE: This sheet is a summary. It may not cover all possible information. If you have questions about this medicine, talk to your doctor, pharmacist, or health care provider.  2015, [employer]. (2011-02-01 13:05:27)

## 2014-06-03 LAB — CBC WITH DIFFERENTIAL/PLATELET
BASOS PCT: 1 % (ref 0–1)
Basophils Absolute: 0.1 10*3/uL (ref 0.0–0.1)
Eosinophils Absolute: 0.1 10*3/uL (ref 0.0–0.7)
Eosinophils Relative: 2 % (ref 0–5)
HEMATOCRIT: 44.1 % (ref 36.0–46.0)
HEMOGLOBIN: 15 g/dL (ref 12.0–15.0)
Lymphocytes Relative: 33 % (ref 12–46)
Lymphs Abs: 2 10*3/uL (ref 0.7–4.0)
MCH: 31.8 pg (ref 26.0–34.0)
MCHC: 34 g/dL (ref 30.0–36.0)
MCV: 93.4 fL (ref 78.0–100.0)
MPV: 10.2 fL (ref 8.6–12.4)
Monocytes Absolute: 0.4 10*3/uL (ref 0.1–1.0)
Monocytes Relative: 7 % (ref 3–12)
NEUTROS ABS: 3.5 10*3/uL (ref 1.7–7.7)
NEUTROS PCT: 57 % (ref 43–77)
Platelets: 257 10*3/uL (ref 150–400)
RBC: 4.72 MIL/uL (ref 3.87–5.11)
RDW: 13.1 % (ref 11.5–15.5)
WBC: 6.1 10*3/uL (ref 4.0–10.5)

## 2014-06-03 LAB — COMPREHENSIVE METABOLIC PANEL
ALBUMIN: 4.1 g/dL (ref 3.5–5.2)
ALK PHOS: 57 U/L (ref 39–117)
ALT: 17 U/L (ref 0–35)
AST: 15 U/L (ref 0–37)
BUN: 10 mg/dL (ref 6–23)
CO2: 28 mEq/L (ref 19–32)
Calcium: 9.2 mg/dL (ref 8.4–10.5)
Chloride: 103 mEq/L (ref 96–112)
Creat: 0.71 mg/dL (ref 0.50–1.10)
GLUCOSE: 85 mg/dL (ref 70–99)
Potassium: 3.8 mEq/L (ref 3.5–5.3)
Sodium: 139 mEq/L (ref 135–145)
Total Bilirubin: 0.4 mg/dL (ref 0.2–1.2)
Total Protein: 6.7 g/dL (ref 6.0–8.3)

## 2014-06-03 LAB — LIPID PANEL
CHOL/HDL RATIO: 2.6 ratio
Cholesterol: 149 mg/dL (ref 0–200)
HDL: 58 mg/dL (ref >39)
LDL Cholesterol: 79 mg/dL (ref 0–99)
Triglycerides: 61 mg/dL (ref <150)
VLDL: 12 mg/dL (ref 0–40)

## 2014-06-03 LAB — URINALYSIS W MICROSCOPIC + REFLEX CULTURE
Bilirubin Urine: NEGATIVE
CRYSTALS: NONE SEEN
Casts: NONE SEEN
Glucose, UA: NEGATIVE mg/dL
Hgb urine dipstick: NEGATIVE
Ketones, ur: NEGATIVE mg/dL
Leukocytes, UA: NEGATIVE
NITRITE: NEGATIVE
PROTEIN: NEGATIVE mg/dL
Specific Gravity, Urine: 1.028 (ref 1.005–1.030)
Urobilinogen, UA: 0.2 mg/dL (ref 0.0–1.0)
pH: 7 (ref 5.0–8.0)

## 2014-06-03 LAB — TSH: TSH: 1.888 u[IU]/mL (ref 0.350–4.500)

## 2014-06-04 LAB — URINE CULTURE: Colony Count: 8000

## 2014-06-16 ENCOUNTER — Ambulatory Visit (INDEPENDENT_AMBULATORY_CARE_PROVIDER_SITE_OTHER): Payer: BLUE CROSS/BLUE SHIELD

## 2014-06-16 ENCOUNTER — Other Ambulatory Visit: Payer: Self-pay | Admitting: Gynecology

## 2014-06-16 ENCOUNTER — Encounter: Payer: Self-pay | Admitting: Gynecology

## 2014-06-16 ENCOUNTER — Ambulatory Visit (INDEPENDENT_AMBULATORY_CARE_PROVIDER_SITE_OTHER): Payer: BLUE CROSS/BLUE SHIELD | Admitting: Gynecology

## 2014-06-16 VITALS — BP 132/84

## 2014-06-16 DIAGNOSIS — K589 Irritable bowel syndrome without diarrhea: Secondary | ICD-10-CM

## 2014-06-16 DIAGNOSIS — R140 Abdominal distension (gaseous): Secondary | ICD-10-CM

## 2014-06-16 DIAGNOSIS — Q504 Embryonic cyst of fallopian tube: Secondary | ICD-10-CM

## 2014-06-16 DIAGNOSIS — N831 Corpus luteum cyst of ovary, unspecified side: Secondary | ICD-10-CM

## 2014-06-16 DIAGNOSIS — N838 Other noninflammatory disorders of ovary, fallopian tube and broad ligament: Secondary | ICD-10-CM

## 2014-06-16 NOTE — Progress Notes (Signed)
   Patient presented to the office today to discuss her ultrasound. Patient had been seen in the office on 06/02/2014 for annual gynecological examination was complaining of constipation feeling bloated. Due to the fact that she was overweight she was asked to come to the office today for an ultrasound for better assessment of her adnexa. Patient also was guarded on Linzess for her chronic constipation. She states that sometimes it causes her to have loose stools. Her recent CBC, comprehensive metabolic panel fasting lipid profile, TSH, and urinalysis were all normal.  Ultrasound today: Uterus measured 10.0 x 6.2 x 5.0 cm with endometrial stripe of 10 mm. Her right ovarian corpus luteum follicle was noted 15 x 11 mm. Left ovary was normal. A left parovarian echo-free cyst measuring 15 x 21 mm avascular was noted. No fluid in the cul-de-sac. No apparent adnexal masses were noted.  Assessment for/plan: Patient with small left parovarian echo-free cyst patient was reassured. If patient continues to have severe IBS I've recommended she follow-up with her gastroenterologist for which I given her names. Otherwise she'll return back next year for annual exam or when necessary.

## 2014-07-04 ENCOUNTER — Ambulatory Visit (INDEPENDENT_AMBULATORY_CARE_PROVIDER_SITE_OTHER): Payer: BLUE CROSS/BLUE SHIELD | Admitting: Gastroenterology

## 2014-07-04 ENCOUNTER — Encounter: Payer: Self-pay | Admitting: Gastroenterology

## 2014-07-04 VITALS — BP 134/84 | HR 72 | Ht 67.25 in | Wt 207.2 lb

## 2014-07-04 DIAGNOSIS — K5900 Constipation, unspecified: Secondary | ICD-10-CM

## 2014-07-04 DIAGNOSIS — R194 Change in bowel habit: Secondary | ICD-10-CM

## 2014-07-04 MED ORDER — MOVIPREP 100 G PO SOLR: 1.0000 | Freq: Once | ORAL | Status: DC

## 2014-07-04 NOTE — Patient Instructions (Addendum)
You will be set up for a colonoscopy for change in bowels ([place], mondays work best). Please start taking citrucel (orange flavored) powder fiber supplement.  This may cause some bloating at first but that usually goes away. Begin with a small spoonful and work your way up to a large, heaping spoonful daily over a week.

## 2014-07-04 NOTE — Progress Notes (Signed)
Review of pertinent gastrointestinal problems: 1. GERD with mild reflux esophagitis: EGD 06/2010 Dr. Ardis Hughs   HPI: This is a  very pleasant 49 year old woman whom I last saw about 4 years ago she is here for a different problem.  Has had trouble with bowels since the summer.  Prior to this change she had qd to QOD BM without difficulty.  Has been constipated since then (will go every 2-3 days, sometimes not for a week). Really has to push and strain.  No overt bleeding.  Weight + 10 pounds in past 5 months.  Tried linzess 134mcg, one pill once daily will cause watery diarrhea without complete clearance.  She tried miralax in past, helped for a brief time.  No colon cancer in family.   She tells me she had a battery of blood tests including thyroid testing and they were all normal. Review of systems: Pertinent positive and negative review of systems were noted in the above HPI section. Complete review of systems was performed and was otherwise normal.    Past Medical History  Diagnosis Date  . Diabetes mellitus without complication     GESTATIONAL    Past Surgical History  Procedure Laterality Date  . Cesarean section    . Achilles tendon repair      Current Outpatient Prescriptions  Medication Sig Dispense Refill  . Linaclotide (LINZESS) 145 MCG CAPS capsule Take 1 capsule (145 mcg total) by mouth daily. 30 capsule 11   No current facility-administered medications for this visit.    Allergies as of 07/04/2014  . (No Known Allergies)    Family History  Problem Relation Age of Onset  . Hypertension Father   . Heart disease Father   . Heart disease Brother   . Diabetes Maternal Grandmother   . Colon cancer Neg Hx   . Heart disease Mother   . Colon polyps Neg Hx   . Kidney disease Neg Hx   . Esophageal cancer Neg Hx   . Gallbladder disease Neg Hx     History   Social History  . Marital Status: Married    Spouse Name: N/A    Number of Children: 2  .  Years of Education: N/A   Occupational History  . [employer]    Social History Main Topics  . Smoking status: Current Some Day Smoker -- 0.25 packs/day for 4 years    Types: Cigarettes  . Smokeless tobacco: Never Used  . Alcohol Use: 0.0 oz/week    0 Not specified per week     Comment: occas. socially  . Drug Use: No  . Sexual Activity: Yes    Birth Control/ Protection: Condom   Other Topics Concern  . Not on file   Social History Narrative       Physical Exam: BP 134/84 mmHg  Pulse 72  Ht 5' 7.25" (1.708 m)  Wt 207 lb 4 oz (94.008 kg)  BMI 32.22 kg/m2  LMP 06/14/2014 Constitutional: generally well-appearing Psychiatric: alert and oriented x3 Eyes: extraocular movements intact Mouth: oral pharynx moist, no lesions Neck: supple no lymphadenopathy Cardiovascular: heart regular rate and rhythm Lungs: clear to auscultation bilaterally Abdomen: soft, nontender, nondistended, no obvious ascites, no peritoneal signs, normal bowel sounds Extremities: no lower extremity edema bilaterally Skin: no lesions on visible extremities    Assessment and plan: 49 y.o. female with  change in bowels, constipation  I cannot point to any specific change in her diet, medications, exercise that could contribute to her change  in her bowels. I recommended we proceed with colonoscopy to rule out structural causes. Given her recent weight gain I think it is unlikely that she has neoplastic cause. She is going to start fiber supplements today and will use her previously prescribed Linzess hopefully every other day and weaning as tolerated.

## 2014-07-21 ENCOUNTER — Encounter: Payer: Self-pay | Admitting: Gastroenterology

## 2014-08-04 ENCOUNTER — Encounter: Payer: Self-pay | Admitting: Gastroenterology

## 2014-08-04 ENCOUNTER — Ambulatory Visit (AMBULATORY_SURGERY_CENTER): Payer: BLUE CROSS/BLUE SHIELD | Admitting: Gastroenterology

## 2014-08-04 VITALS — BP 121/75 | HR 71 | Temp 98.4°F | Resp 11 | Ht 67.25 in | Wt 207.0 lb

## 2014-08-04 DIAGNOSIS — D120 Benign neoplasm of cecum: Secondary | ICD-10-CM

## 2014-08-04 DIAGNOSIS — D125 Benign neoplasm of sigmoid colon: Secondary | ICD-10-CM

## 2014-08-04 DIAGNOSIS — K635 Polyp of colon: Secondary | ICD-10-CM

## 2014-08-04 DIAGNOSIS — R194 Change in bowel habit: Secondary | ICD-10-CM

## 2014-08-04 MED ORDER — SODIUM CHLORIDE 0.9 % IV SOLN: 500.0000 mL | INTRAVENOUS | Status: DC

## 2014-08-04 NOTE — Progress Notes (Signed)
Report to PACU, RN, vss, BBS= Clear.  

## 2014-08-04 NOTE — Op Note (Signed)
[place]  [address] [place], [address]   COLONOSCOPY PROCEDURE REPORT  PATIENT: [name], [name]  MR#: [id number] BIRTHDATE: 1966/02/04 , 89  yrs. old GENDER: female ENDOSCOPIST: Milus Banister, MD PROCEDURE DATE:  08/04/2014 PROCEDURE:   Colonoscopy, diagnostic and Colonoscopy with snare polypectomy First Screening Colonoscopy - Avg.  risk and is 50 yrs.  old or older - No.  Prior Negative Screening - Now for repeat screening. N/A  History of Adenoma - Now for follow-up colonoscopy & has been > or = to 3 yrs.  N/A  Polyps Removed Today? Yes. ASA CLASS:   Class II INDICATIONS:change in bowels. MEDICATIONS: Monitored anesthesia care and Propofol 300 mg IV  DESCRIPTION OF PROCEDURE:   After the risks benefits and alternatives of the procedure were thoroughly explained, informed consent was obtained.  The digital rectal exam revealed no abnormalities of the rectum.   The LB [id number]  endoscope was introduced through the anus and advanced to the cecum, which was identified by both the appendix and ileocecal valve. No adverse events experienced.   The quality of the prep was excellent.  The instrument was then slowly withdrawn as the colon was fully examined.   COLON FINDINGS: Two sessile polyps ranging between 5-65mm in size were found in the sigmoid colon and at the cecum.  Polypectomies were performed with a cold snare.  The resection was complete, the polyp tissue was completely retrieved and sent to histology.   The examination was otherwise normal.  Retroflexed views revealed no abnormalities. The time to cecum = 3.3 Withdrawal time = 10.0   The scope was withdrawn and the procedure completed. COMPLICATIONS: There were no immediate complications.  ENDOSCOPIC IMPRESSION: 1.   Two sessile polyps ranging between 5-46mm in size were found in the sigmoid colon and at the cecum; polypectomies were performed with a cold snare 2.   The examination  was otherwise normal  RECOMMENDATIONS: 1. If the polyp(s) removed today are proven to be adenomatous (pre-cancerous) polyps, you will need a repeat colonoscopy in 5 years.  Otherwise you should continue to follow colorectal cancer screening guidelines for "routine risk" patients with colonoscopy in 10 years.  You will receive a letter within 1-2 weeks with the results of your biopsy as well as final recommendations.  Please call my office if you have not received a letter after 3 weeks. 2. Continue daily fiber supplement and PRN linzess  eSigned:  Milus Banister, MD 08/04/2014 3:07 PM

## 2014-08-04 NOTE — Progress Notes (Signed)
Called to room to assist during endoscopic procedure.  Patient ID and intended procedure confirmed with present staff. Received instructions for my participation in the procedure from the performing physician.  

## 2014-08-04 NOTE — Progress Notes (Signed)
Patient denies any allergies to eggs or soy. 

## 2014-08-04 NOTE — Patient Instructions (Signed)
Discharge instructions given. Handout on polyps. Resume previous medications. YOU HAD AN ENDOSCOPIC PROCEDURE TODAY AT THE [facility]:   Refer to the procedure report that was given to you for any specific questions about what was found during the examination.  If the procedure report does not answer your questions, please call your gastroenterologist to clarify.  If you requested that your care partner not be given the details of your procedure findings, then the procedure report has been included in a sealed envelope for you to review at your convenience later.  YOU SHOULD EXPECT: Some feelings of bloating in the abdomen. Passage of more gas than usual.  Walking can help get rid of the air that was put into your GI tract during the procedure and reduce the bloating. If you had a lower endoscopy (such as a colonoscopy or flexible sigmoidoscopy) you may notice spotting of blood in your stool or on the toilet paper. If you underwent a bowel prep for your procedure, you may not have a normal bowel movement for a few days.  Please Note:  You might notice some irritation and congestion in your nose or some drainage.  This is from the oxygen used during your procedure.  There is no need for concern and it should clear up in a day or so.  SYMPTOMS TO REPORT IMMEDIATELY:   Following lower endoscopy (colonoscopy or flexible sigmoidoscopy):  Excessive amounts of blood in the stool  Significant tenderness or worsening of abdominal pains  Swelling of the abdomen that is new, acute  Fever of 100F or higher   For urgent or emergent issues, a gastroenterologist can be reached at any hour by calling [phone].   DIET: Your first meal following the procedure should be a small meal and then it is ok to progress to your normal diet. Heavy or fried foods are harder to digest and may make you feel nauseous or bloated.  Likewise, meals heavy in dairy and vegetables can increase bloating.  Drink  plenty of fluids but you should avoid alcoholic beverages for 24 hours.  ACTIVITY:  You should plan to take it easy for the rest of today and you should NOT DRIVE or use heavy machinery until tomorrow (because of the sedation medicines used during the test).    FOLLOW UP: Our staff will call the number listed on your records the next business day following your procedure to check on you and address any questions or concerns that you may have regarding the information given to you following your procedure. If we do not reach you, we will leave a message.  However, if you are feeling well and you are not experiencing any problems, there is no need to return our call.  We will assume that you have returned to your regular daily activities without incident.  If any biopsies were taken you will be contacted by phone or by letter within the next 1-3 weeks.  Please call us at [phone] if you have not heard about the biopsies in 3 weeks.    SIGNATURES/CONFIDENTIALITY: You and/or your care partner have signed paperwork which will be entered into your electronic medical record.  These signatures attest to the fact that that the information above on your After Visit Summary has been reviewed and is understood.  Full responsibility of the confidentiality of this discharge information lies with you and/or your care-partner. 

## 2014-08-05 ENCOUNTER — Telehealth: Payer: Self-pay

## 2014-08-05 NOTE — Telephone Encounter (Signed)
  Follow up Call-  Call back number 08/04/2014  Post procedure Call Back phone  # [phone]  Permission to leave phone message Yes     Patient questions:  Do you have a fever, pain , or abdominal swelling? No. Pain Score  0 *  Have you tolerated food without any problems? Yes.    Have you been able to return to your normal activities? Yes.    Do you have any questions about your discharge instructions: Diet   No. Medications  No. Follow up visit  No.  Do you have questions or concerns about your Care? No.  Actions: * If pain score is 4 or above: No action needed, pain <4.

## 2014-08-06 ENCOUNTER — Telehealth: Payer: Self-pay | Admitting: Gastroenterology

## 2014-08-07 NOTE — Telephone Encounter (Signed)
Left message on machine to call back  

## 2014-08-08 ENCOUNTER — Ambulatory Visit: Payer: Self-pay | Admitting: Gastroenterology

## 2014-08-12 ENCOUNTER — Encounter: Payer: Self-pay | Admitting: Gastroenterology

## 2014-08-14 NOTE — Telephone Encounter (Signed)
Pt feels better, she actually had the flu last week

## 2015-04-02 ENCOUNTER — Ambulatory Visit (INDEPENDENT_AMBULATORY_CARE_PROVIDER_SITE_OTHER): Payer: BLUE CROSS/BLUE SHIELD | Admitting: Gastroenterology

## 2015-04-02 ENCOUNTER — Encounter: Payer: Self-pay | Admitting: Gastroenterology

## 2015-04-02 VITALS — BP 130/80 | HR 80 | Ht 67.25 in | Wt 209.2 lb

## 2015-04-02 DIAGNOSIS — R1010 Upper abdominal pain, unspecified: Secondary | ICD-10-CM

## 2015-04-02 NOTE — Progress Notes (Signed)
Review of pertinent gastrointestinal problems: 1. GERD with mild reflux esophagitis: EGD 06/2010 Dr. Ardis Hughs 2. Change in bowels; colonoscopy 07/2014 Dr. Ardis Hughs: found 2 subCM polyps, one was SSA, recommended recall at 5 years.    HPI: This is a  very pleasant 49 year old woman whom I last saw 7 or 8 months ago. She works in [employer].   Chief complaint is intermittent epigastric abdominal pains  Upper abdominal dull pains, can be tight and hard at times.  Intermittently, lasts 30 min to one hour.    Pains do not radiate, there is usually associated with nausea. Occur nearly daily  These pains started about 2 months ago.  Pain is bothering her daily.  Pretty rare nsaids.  Was having signficant GERD  several weeks ago but that improved after short course of increased PPI.   Weight overall stable.  No over bleeding.  No real dietary changes.  Past Medical History  Diagnosis Date  . Diabetes mellitus without complication ([place])     GESTATIONAL    Past Surgical History  Procedure Laterality Date  . Cesarean section      x2  . Achilles tendon repair Left 2006  . Upper gastrointestinal endoscopy    . Wisdom tooth extraction      No current outpatient prescriptions on file.   No current facility-administered medications for this visit.    Allergies as of 04/02/2015  . (No Known Allergies)    Family History  Problem Relation Age of Onset  . Hypertension Father   . Heart disease Father   . Heart disease Brother   . Diabetes Maternal Grandmother   . Colon cancer Neg Hx   . Colon polyps Neg Hx   . Kidney disease Neg Hx   . Esophageal cancer Neg Hx   . Gallbladder disease Mother     in her 74s  . Heart disease Mother   . Gallbladder disease Maternal Grandmother     Social History   Social History  . Marital Status: Married    Spouse Name: N/A  . Number of Children: 2  . Years of Education: N/A   Occupational History  . [employer]    Social  History Main Topics  . Smoking status: Current Some Day Smoker -- 0.25 packs/day for 4 years    Types: Cigarettes  . Smokeless tobacco: Never Used     Comment: form given 04/02/15  . Alcohol Use: 0.0 oz/week    0 Standard drinks or equivalent per week     Comment: occas. socially  . Drug Use: No  . Sexual Activity: Yes    Birth Control/ Protection: Condom   Other Topics Concern  . Not on file   Social History Narrative     Physical Exam: BP 130/80 mmHg  Pulse 80  Ht 5' 7.25" (1.708 m)  Wt 209 lb 3.2 oz (94.892 kg)  BMI 32.53 kg/m2 Constitutional: generally well-appearing Psychiatric: alert and oriented x3 Abdomen: soft, nontender, nondistended, no obvious ascites, no peritoneal signs, normal bowel sounds   Assessment and plan: 49 y.o. female with intermittent epigastric abdominal pains associated with nausea  I'm suspicious of biliary colic. I recommended abdominal ultrasound to check for gallstones and if these are seen in I would likely refer her to general surgery to consider laparoscopic cholecystectomy. If no gallstones are noted then I believe the next step would be HIDA scan to estimate her gallbladder ejection fraction, check for biliary dyskinesia.   Owens Loffler, MD [facility]  04/02/2015, 3:47 PM

## 2015-04-02 NOTE — Patient Instructions (Addendum)
You have been given a separate informational sheet regarding your tobacco use, the importance of quitting and local resources to help you quit.  You have been scheduled for an abdominal ultrasound at [facility] (1st floor of hospital) on 04/09/15 Thursday at 8:00 am. Please arrive 15 minutes prior to your appointment for registration. Make certain not to have anything to eat or drink 6 hours prior to your appointment. Should you need to reschedule your appointment, please contact radiology at [phone]. This test typically takes about 30 minutes to perform.

## 2015-04-06 ENCOUNTER — Ambulatory Visit: Payer: BLUE CROSS/BLUE SHIELD | Admitting: Gastroenterology

## 2015-04-09 ENCOUNTER — Ambulatory Visit (HOSPITAL_COMMUNITY)
Admission: RE | Admit: 2015-04-09 | Discharge: 2015-04-09 | Disposition: A | Discharge status: Home / Self Care | Payer: BLUE CROSS/BLUE SHIELD | Source: Ambulatory Visit | Attending: Gastroenterology | Admitting: Gastroenterology

## 2015-04-09 DIAGNOSIS — R932 Abnormal findings on diagnostic imaging of liver and biliary tract: Secondary | ICD-10-CM | POA: Diagnosis not present

## 2015-04-09 DIAGNOSIS — R1010 Upper abdominal pain, unspecified: Secondary | ICD-10-CM | POA: Diagnosis present

## 2015-04-14 ENCOUNTER — Telehealth: Payer: Self-pay | Admitting: Gastroenterology

## 2015-04-14 DIAGNOSIS — K828 Other specified diseases of gallbladder: Secondary | ICD-10-CM

## 2015-04-14 NOTE — Telephone Encounter (Signed)
Spoke to patient and  HIDA scan scheduled at [facility] (1st floor) on 05-04-15 @ 9:30am. Patient to be NPO 6 hours prior to the test and take no stomach meds. Advised patient that if she needs to reschedule to call radiology at [phone]. Patient notified of appointment details and has no question or concerns at this time.

## 2015-04-14 NOTE — Telephone Encounter (Signed)
Please call her.  [place] was esentially normal.  GB looks normal but I want HIDA with [facility] to check for biliary dyskinesia.  Thanks

## 2015-04-14 NOTE — Telephone Encounter (Signed)
Patient had an ABD ultrasound done on 04-09-15 and calling for results. Please advise

## 2015-04-14 NOTE — Telephone Encounter (Signed)
Called patient, msg left to contact office. Order placed in Epic for HIDA scan but did not schedule. Will find out when patient is able to go to [facility] for the scan

## 2015-05-04 ENCOUNTER — Ambulatory Visit (HOSPITAL_COMMUNITY)
Admission: RE | Admit: 2015-05-04 | Discharge: 2015-05-04 | Disposition: A | Discharge status: Home / Self Care | Payer: BLUE CROSS/BLUE SHIELD | Source: Ambulatory Visit | Attending: Gastroenterology | Admitting: Gastroenterology

## 2015-05-04 DIAGNOSIS — K219 Gastro-esophageal reflux disease without esophagitis: Secondary | ICD-10-CM | POA: Diagnosis not present

## 2015-05-04 DIAGNOSIS — R112 Nausea with vomiting, unspecified: Secondary | ICD-10-CM | POA: Diagnosis not present

## 2015-05-04 DIAGNOSIS — K828 Other specified diseases of gallbladder: Secondary | ICD-10-CM

## 2015-05-04 MED ORDER — TECHNETIUM TC 99M MEBROFENIN IV KIT
5.0000 | PACK | Freq: Once | INTRAVENOUS | Status: AC | PRN
  Administered 2015-05-04: 5 via INTRAVENOUS

## 2015-05-04 MED ORDER — SINCALIDE 5 MCG IJ SOLR
0.0200 ug/kg | Freq: Once | INTRAMUSCULAR | Status: AC
  Administered 2015-05-04: 1.9 ug via INTRAVENOUS

## 2015-05-06 ENCOUNTER — Telehealth: Payer: Self-pay | Admitting: Gastroenterology

## 2015-05-06 NOTE — Telephone Encounter (Signed)
See result note.  

## 2015-05-14 ENCOUNTER — Ambulatory Visit (AMBULATORY_SURGERY_CENTER): Payer: Self-pay

## 2015-05-14 VITALS — Ht 68.0 in | Wt 210.0 lb

## 2015-05-14 DIAGNOSIS — R1010 Upper abdominal pain, unspecified: Secondary | ICD-10-CM

## 2015-05-14 DIAGNOSIS — R1011 Right upper quadrant pain: Principal | ICD-10-CM

## 2015-05-14 DIAGNOSIS — G8929 Other chronic pain: Secondary | ICD-10-CM

## 2015-05-14 NOTE — Progress Notes (Signed)
No allergies to eggs or soy or flu shot No diet/weight loss meds No home oxygen No past problems with anesthesia  Has email and internet; refused emmi

## 2015-05-15 ENCOUNTER — Ambulatory Visit (AMBULATORY_SURGERY_CENTER): Payer: BLUE CROSS/BLUE SHIELD | Admitting: Gastroenterology

## 2015-05-15 ENCOUNTER — Encounter: Payer: Self-pay | Admitting: Gastroenterology

## 2015-05-15 VITALS — BP 146/95 | HR 82 | Temp 95.9°F | Resp 16 | Ht 68.0 in | Wt 210.0 lb

## 2015-05-15 DIAGNOSIS — K2990 Gastroduodenitis, unspecified, without bleeding: Secondary | ICD-10-CM | POA: Diagnosis not present

## 2015-05-15 DIAGNOSIS — K2950 Unspecified chronic gastritis without bleeding: Secondary | ICD-10-CM | POA: Diagnosis not present

## 2015-05-15 DIAGNOSIS — R1110 Vomiting, unspecified: Secondary | ICD-10-CM

## 2015-05-15 DIAGNOSIS — R1010 Upper abdominal pain, unspecified: Secondary | ICD-10-CM

## 2015-05-15 DIAGNOSIS — K2970 Gastritis, unspecified, without bleeding: Secondary | ICD-10-CM

## 2015-05-15 MED ORDER — SODIUM CHLORIDE 0.9 % IV SOLN: 500.0000 mL | INTRAVENOUS | Status: DC

## 2015-05-15 NOTE — Progress Notes (Signed)
A/ox3 pleased with MAC, report to Jill RN 

## 2015-05-15 NOTE — Op Note (Signed)
[place]  [address]. [place], [address]   ENDOSCOPY PROCEDURE REPORT  PATIENT: [name], [name]  MR#: [id number] BIRTHDATE: 07-30-1965 , 55  yrs. old GENDER: female ENDOSCOPIST: Milus Banister, MD PROCEDURE DATE:  05/15/2015 PROCEDURE:  EGD w/ biopsy ASA CLASS:     Class II INDICATIONS:  abdominal pain, vomiting; usually post prandial ([place] and HIDA both normal), no weight loss. MEDICATIONS: Monitored anesthesia care and Propofol 200 mg IV TOPICAL ANESTHETIC: none  DESCRIPTION OF PROCEDURE: After the risks benefits and alternatives of the procedure were thoroughly explained, informed consent was obtained.  The LB [id number] endoscope was introduced through the mouth and advanced to the second portion of the duodenum , Without limitations.  The instrument was slowly withdrawn as the mucosa was fully examined.  There was mild, non-specific distal gastritis.  This was biopsied and sent to pathology (antrum and body).  There was LA Grada A reflux esophagitis.  The GE junction was slightly irregular, edematous but not overtly neoplastic.  This was biopsied as well. The examination was otherwise normal.  Retroflexed views revealed no abnormalities.     The scope was then withdrawn from the patient and the procedure completed. COMPLICATIONS: There were no immediate complications.  ENDOSCOPIC IMPRESSION: There was mild, non-specific distal gastritis.  This was biopsied and sent to pathology (antrum and body).  There was LA Grada A reflux esophagitis.  The GE junction was slightly irregular, edematous but not overtly neoplastic.  This was biopsied as well. The examination was otherwise normal  RECOMMENDATIONS: Await pathology results.  If the biopsies are unrevealing, the next test for GI evaluation would be a nuc med gastric emptying scan.   eSigned:  Milus Banister, MD 05/15/2015 9:01 AM

## 2015-05-15 NOTE — Patient Instructions (Addendum)
YOU HAD AN ENDOSCOPIC PROCEDURE TODAY AT [facility]:   Refer to the procedure report that was given to you for any specific questions about what was found during the examination.  If the procedure report does not answer your questions, please call your gastroenterologist to clarify.  If you requested that your care partner not be given the details of your procedure findings, then the procedure report has been included in a sealed envelope for you to review at your convenience later.  YOU SHOULD EXPECT: Some feelings of bloating in the abdomen. Passage of more gas than usual.  Walking can help get rid of the air that was put into your GI tract during the procedure and reduce the bloating. If you had a lower endoscopy (such as a colonoscopy or flexible sigmoidoscopy) you may notice spotting of blood in your stool or on the toilet paper. If you underwent a bowel prep for your procedure, you may not have a normal bowel movement for a few days.  Please Note:  You might notice some irritation and congestion in your nose or some drainage.  This is from the oxygen used during your procedure.  There is no need for concern and it should clear up in a day or so.  SYMPTOMS TO REPORT IMMEDIATELY:    Following upper endoscopy (EGD)  Vomiting of blood or coffee ground material  New chest pain or pain under the shoulder blades  Painful or persistently difficult swallowing  New shortness of breath  Fever of 100F or higher  Black, tarry-looking stools  For urgent or emergent issues, a gastroenterologist can be reached at any hour by calling [phone].   DIET:   ACTIVITY:  You should plan to take it easy for the rest of today and you should NOT DRIVE or use heavy machinery until tomorrow (because of the sedation medicines used during the test).    FOLLOW UP: Our staff will call the number listed on your records the next business day following your procedure to check on you and address  any questions or concerns that you may have regarding the information given to you following your procedure. If we do not reach you, we will leave a message.  However, if you are feeling well and you are not experiencing any problems, there is no need to return our call.  We will assume that you have returned to your regular daily activities without incident.  If any biopsies were taken you will be contacted by phone or by letter within the next 1-3 weeks.  Please call us at [phone] if you have not heard about the biopsies in 3 weeks.    SIGNATURES/CONFIDENTIALITY: You and/or your care partner have signed paperwork which will be entered into your electronic medical record.  These signatures attest to the fact that that the information above on your After Visit Summary has been reviewed and is understood.  Full responsibility of the confidentiality of this discharge information lies with you and/or your care-partner.YOU HAD AN ENDOSCOPIC PROCEDURE TODAY AT [facility]:   Refer to the procedure report that was given to you for any specific questions about what was found during the examination.  If the procedure report does not answer your questions, please call your gastroenterologist to clarify.  If you requested that your care partner not be given the details of your procedure findings, then the procedure report has been included in a sealed envelope for you to review at  your convenience later.  YOU SHOULD EXPECT: Please Note:  You might notice some irritation and congestion in your nose or some drainage.  This is from the oxygen used during your procedure.  There is no need for concern and it should clear up in a day or so.  SYMPTOMS TO REPORT IMMEDIATELY:    Following upper endoscopy (EGD)  Vomiting of blood or coffee ground material  New chest pain or pain under the shoulder blades  Painful or persistently difficult swallowing  New shortness of breath  Fever of 100F or  higher  Black, tarry-looking stools  For urgent or emergent issues, a gastroenterologist can be reached at any hour by calling [phone].   DIET: Your first meal following the procedure should be a small meal and then it is ok to progress to your normal diet. Heavy or fried foods are harder to digest and may make you feel nauseous or bloated.  Likewise, meals heavy in dairy and vegetables can increase bloating.  Drink plenty of fluids but you should avoid alcoholic beverages for 24 hours.  ACTIVITY:  You should plan to take it easy for the rest of today and you should NOT DRIVE or use heavy machinery until tomorrow (because of the sedation medicines used during the test).    FOLLOW UP: Our staff will call the number listed on your records the next business day following your procedure to check on you and address any questions or concerns that you may have regarding the information given to you following your procedure. If we do not reach you, we will leave a message.  However, if you are feeling well and you are not experiencing any problems, there is no need to return our call.  We will assume that you have returned to your regular daily activities without incident.  If any biopsies were taken you will be contacted by phone or by letter within the next 1-3 weeks.  Please call us at [phone] if you have not heard about the biopsies in 3 weeks.    SIGNATURES/CONFIDENTIALITY: You and/or your care partner have signed paperwork which will be entered into your electronic medical record.  These signatures attest to the fact that that the information above on your After Visit Summary has been reviewed and is understood.  Full responsibility of the confidentiality of this discharge information lies with you and/or your care-partner.

## 2015-05-15 NOTE — Progress Notes (Signed)
Dental advisory given to patient 

## 2015-05-15 NOTE — Progress Notes (Signed)
Called to room to assist during endoscopic procedure.  Patient ID and intended procedure confirmed with present staff. Received instructions for my participation in the procedure from the performing physician.  

## 2015-05-18 ENCOUNTER — Telehealth: Payer: Self-pay | Admitting: *Deleted

## 2015-05-18 NOTE — Telephone Encounter (Signed)
  Follow up Call-  Call back number 05/15/2015 08/04/2014  Post procedure Call Back phone  # [phone]  Permission to leave phone message Yes Yes     Patient questions:  Do you have a fever, pain , or abdominal swelling? No. Pain Score  0 *  Have you tolerated food without any problems? Yes.    Have you been able to return to your normal activities? Yes.    Do you have any questions about your discharge instructions: Diet   No. Medications  No. Follow up visit  No.  Do you have questions or concerns about your Care? No.  Actions: * If pain score is 4 or above: No action needed, pain <4.

## 2015-05-22 ENCOUNTER — Telehealth: Payer: Self-pay | Admitting: Gastroenterology

## 2015-05-26 NOTE — Telephone Encounter (Signed)
[name], please call her. The biopsies from recent EGD showed no sign of infection or cancer. She needs gastric emptying scan to continue the workup of her bloating, abd pains.         Santiago Glad, no result note is needed.

## 2015-05-26 NOTE — Telephone Encounter (Signed)
The pt has been notified and will call back if she decides to have the [place].

## 2015-12-05 IMAGING — US US ABDOMEN COMPLETE
1 series · 13 of 25 positions shown · non-contrast
Comparison: None.

CLINICAL DATA: Upper abdominal pain

EXAM:
ULTRASOUND ABDOMEN COMPLETE

[Series 1: us abdomen complete · 0.20mm/px · 13 of 116 slices shown]
[im 1/116]
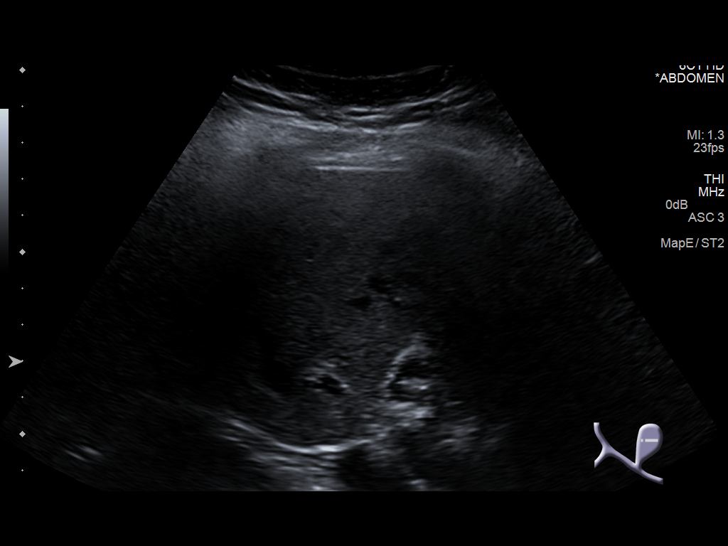
[im 10/116]
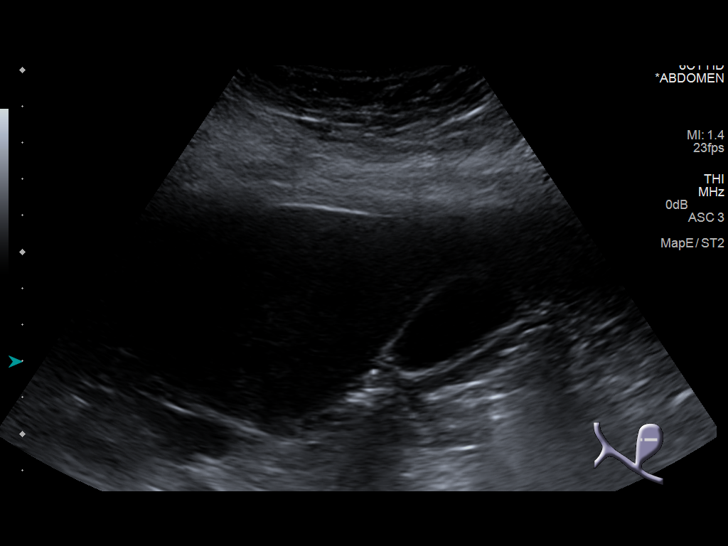
[im 20/116]
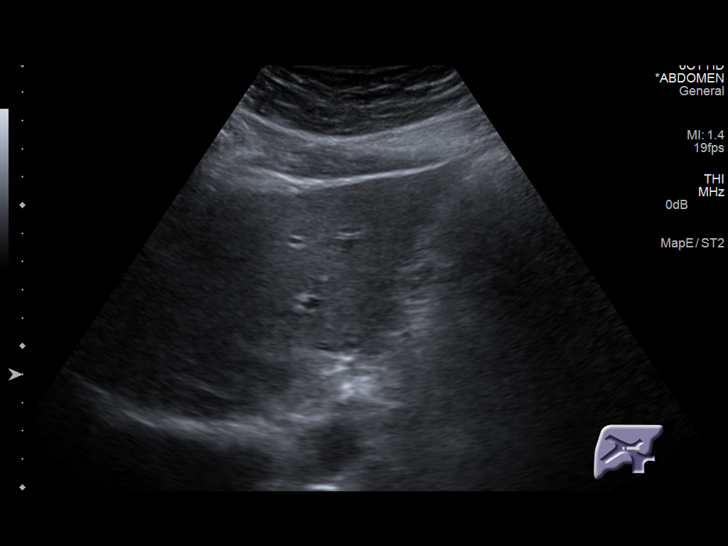
[im 29/116]
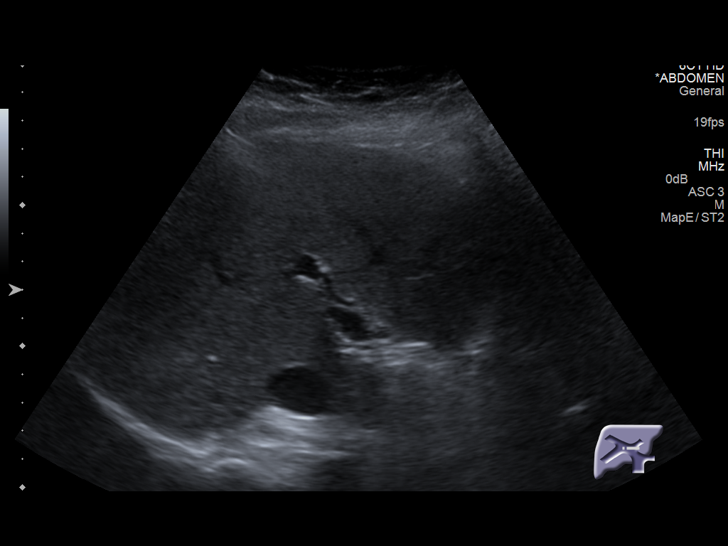
[im 39/116]
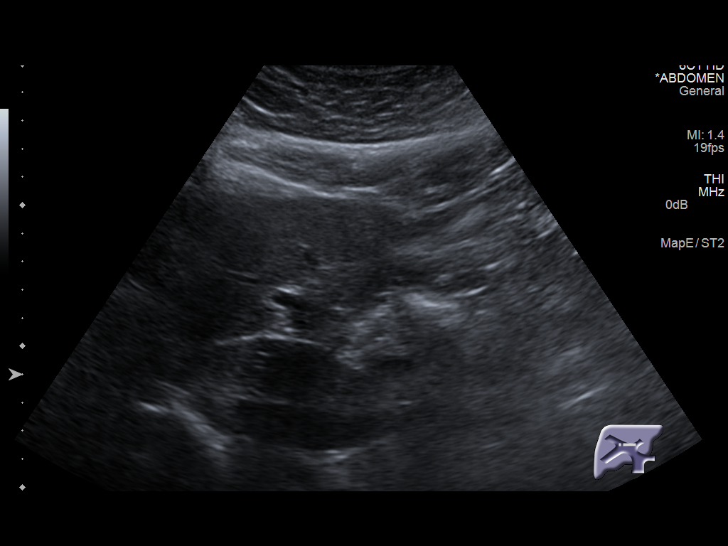
[im 48/116]
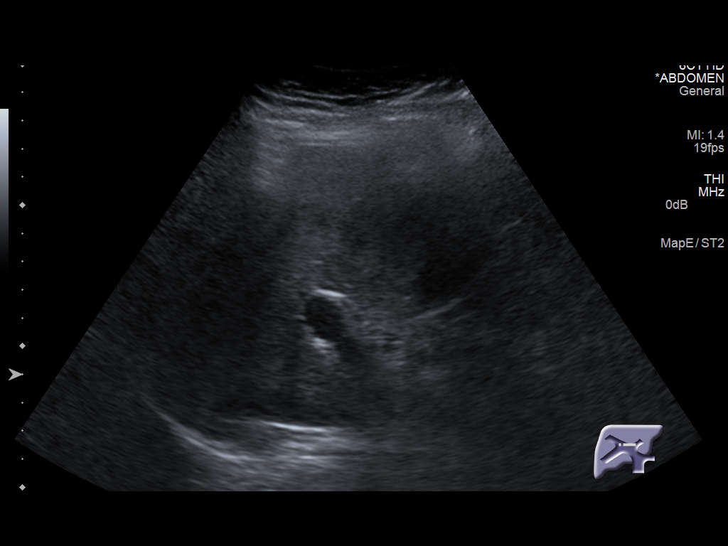
[im 58/116]
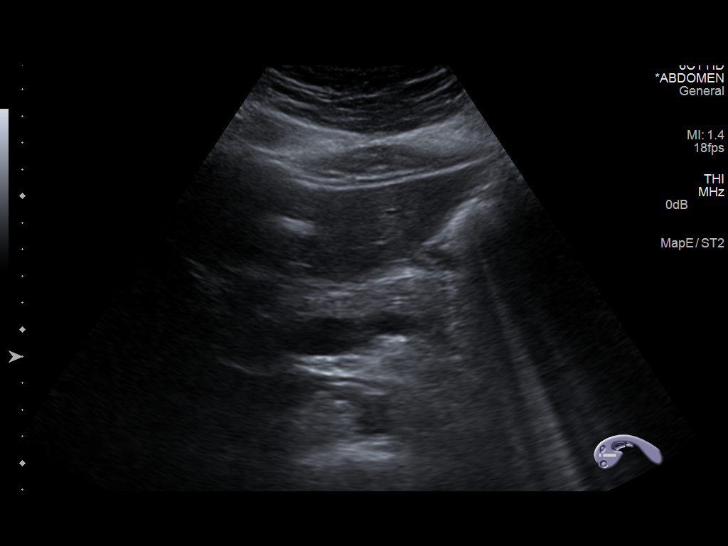
[im 68/116]
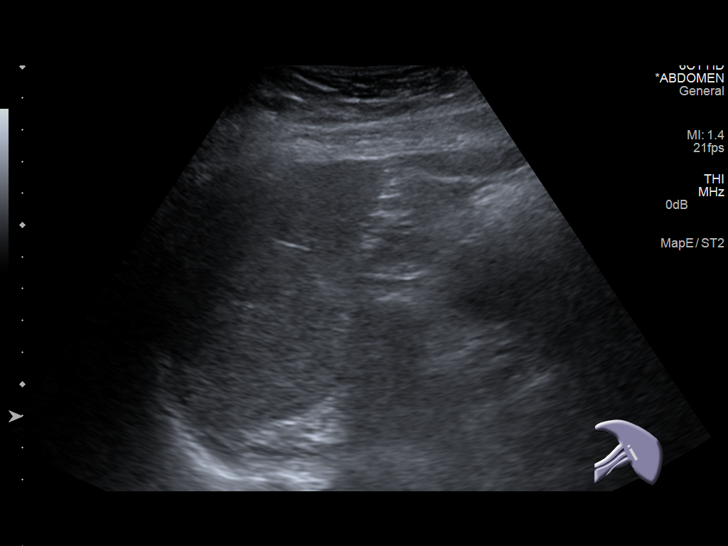
[im 77/116]
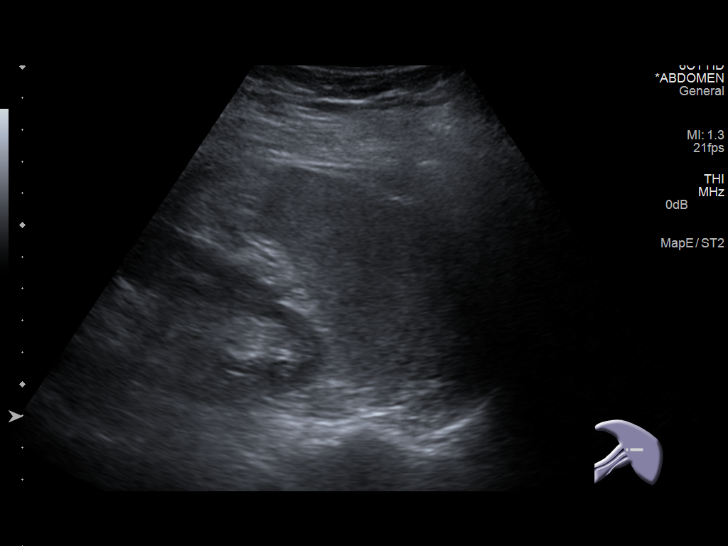
[im 87/116]
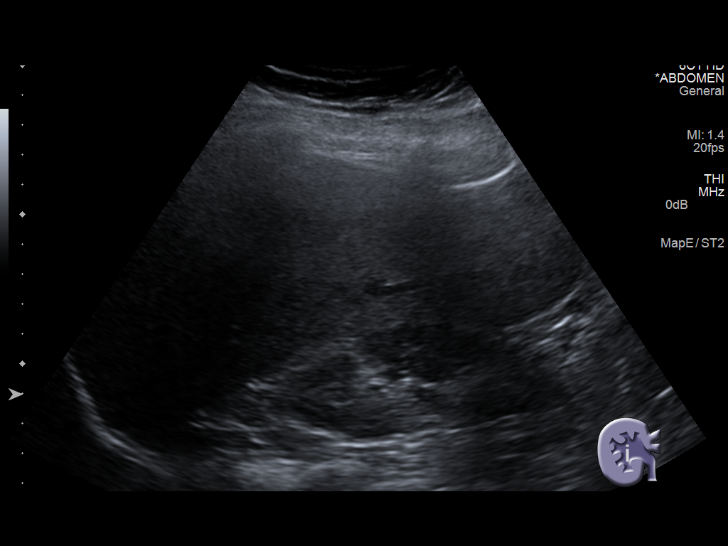
[im 96/116]
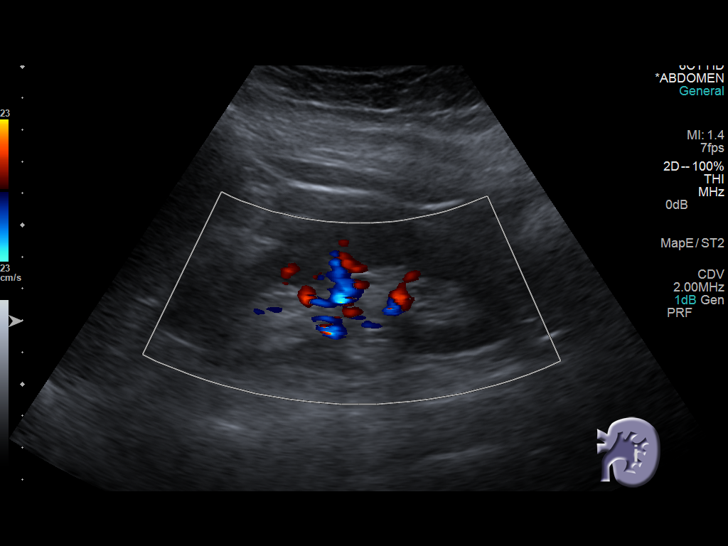
[im 106/116]
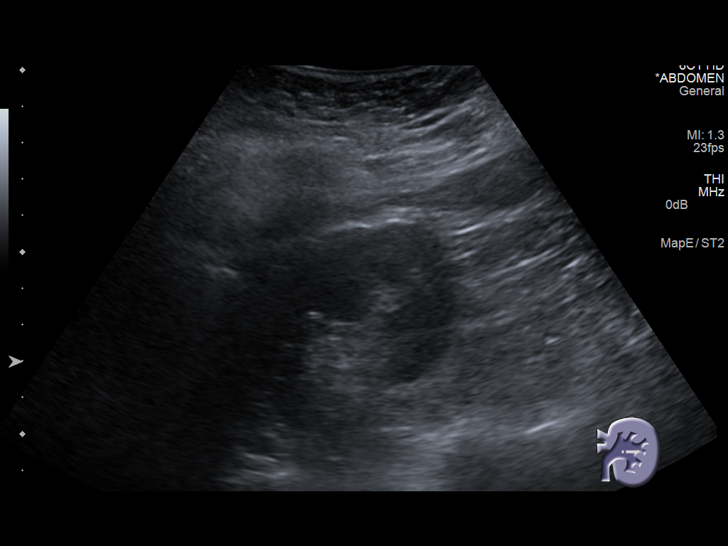
[im 116/116]
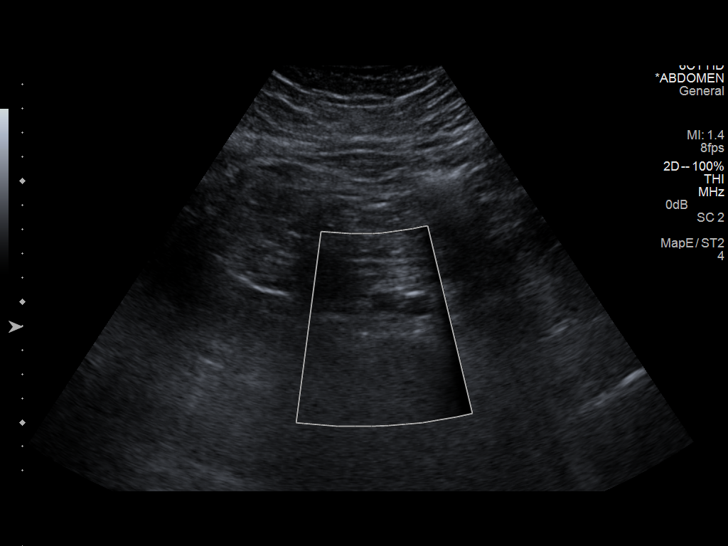

[13 of 25 positions shown; findings below may reference images not displayed]

FINDINGS: Gallbladder: No gallstones or wall thickening visualized. There is
no pericholecystic fluid. No sonographic Murphy sign noted.

Common bile duct: Diameter: 5 mm. No intrahepatic, common hepatic,
or common bile duct dilatation.

Liver: No focal lesion identified. Liver echogenicity is overall
increased.

IVC: No abnormality visualized.

Pancreas: Visualized portion unremarkable. Portions of pancreas
obscured by gas.

Spleen: Size and appearance within normal limits.

Right Kidney: Length: 11.9 cm. Echogenicity within normal limits. No
mass or hydronephrosis visualized.

Left Kidney: Length: 11.6 cm. Echogenicity within normal limits. No
mass or hydronephrosis visualized.

Abdominal aorta: No aneurysm visualized.

Other findings: No demonstrable ascites.
IMPRESSION: Portions of pancreas obscured by gas. Visualized portions of
pancreas appear normal.

Liver echogenicity overall is increased, likely due to hepatic
steatosis. While no focal liver lesions are identified, it must be
cautioned that the sensitivity of ultrasound focal liver lesions is
diminished in this circumstance.

Study otherwise unremarkable.

## 2016-08-18 ENCOUNTER — Encounter: Payer: BLUE CROSS/BLUE SHIELD | Admitting: Gynecology

## 2016-08-22 ENCOUNTER — Encounter: Payer: Self-pay | Admitting: Gynecology

## 2016-08-22 ENCOUNTER — Ambulatory Visit (INDEPENDENT_AMBULATORY_CARE_PROVIDER_SITE_OTHER): Payer: BLUE CROSS/BLUE SHIELD | Admitting: Gynecology

## 2016-08-22 VITALS — BP 116/76 | Ht 68.0 in | Wt 209.0 lb

## 2016-08-22 DIAGNOSIS — B9689 Other specified bacterial agents as the cause of diseases classified elsewhere: Secondary | ICD-10-CM | POA: Diagnosis not present

## 2016-08-22 DIAGNOSIS — T192XXA Foreign body in vulva and vagina, initial encounter: Secondary | ICD-10-CM

## 2016-08-22 DIAGNOSIS — N3281 Overactive bladder: Secondary | ICD-10-CM

## 2016-08-22 DIAGNOSIS — N898 Other specified noninflammatory disorders of vagina: Secondary | ICD-10-CM | POA: Diagnosis not present

## 2016-08-22 DIAGNOSIS — Z01411 Encounter for gynecological examination (general) (routine) with abnormal findings: Secondary | ICD-10-CM

## 2016-08-22 DIAGNOSIS — R635 Abnormal weight gain: Secondary | ICD-10-CM

## 2016-08-22 DIAGNOSIS — N760 Acute vaginitis: Secondary | ICD-10-CM

## 2016-08-22 LAB — LIPID PANEL
CHOL/HDL RATIO: 3.7 ratio (ref <5.0)
Cholesterol: 159 mg/dL (ref <200)
HDL: 43 mg/dL — ABNORMAL LOW (ref >50)
LDL Cholesterol: 94 mg/dL (ref <100)
Triglycerides: 111 mg/dL (ref <150)
VLDL: 22 mg/dL (ref <30)

## 2016-08-22 LAB — CBC WITH DIFFERENTIAL/PLATELET
BASOS ABS: 0 {cells}/uL (ref 0–200)
Basophils Relative: 0 %
EOS ABS: 114 {cells}/uL (ref 15–500)
Eosinophils Relative: 2 %
HEMATOCRIT: 43 % (ref 35.0–45.0)
Hemoglobin: 14.7 g/dL (ref 11.7–15.5)
LYMPHS PCT: 33 %
Lymphs Abs: 1881 cells/uL (ref 850–3900)
MCH: 32 pg (ref 27.0–33.0)
MCHC: 34.2 g/dL (ref 32.0–36.0)
MCV: 93.7 fL (ref 80.0–100.0)
MPV: 9.6 fL (ref 7.5–12.5)
Monocytes Absolute: 570 cells/uL (ref 200–950)
Monocytes Relative: 10 %
NEUTROS PCT: 55 %
Neutro Abs: 3135 cells/uL (ref 1500–7800)
Platelets: 223 10*3/uL (ref 140–400)
RBC: 4.59 MIL/uL (ref 3.80–5.10)
RDW: 13.3 % (ref 11.0–15.0)
WBC: 5.7 10*3/uL (ref 3.8–10.8)

## 2016-08-22 LAB — COMPREHENSIVE METABOLIC PANEL
ALBUMIN: 3.9 g/dL (ref 3.6–5.1)
ALT: 13 U/L (ref 6–29)
AST: 13 U/L (ref 10–35)
Alkaline Phosphatase: 52 U/L (ref 33–115)
BILIRUBIN TOTAL: 0.5 mg/dL (ref 0.2–1.2)
BUN: 10 mg/dL (ref 7–25)
CO2: 24 mmol/L (ref 20–31)
CREATININE: 0.76 mg/dL (ref 0.50–1.10)
Calcium: 8.9 mg/dL (ref 8.6–10.2)
Chloride: 109 mmol/L (ref 98–110)
Glucose, Bld: 106 mg/dL — ABNORMAL HIGH (ref 65–99)
Potassium: 4.2 mmol/L (ref 3.5–5.3)
SODIUM: 139 mmol/L (ref 135–146)
Total Protein: 6.4 g/dL (ref 6.1–8.1)

## 2016-08-22 LAB — WET PREP FOR TRICH, YEAST, CLUE
Trich, Wet Prep: NONE SEEN
Yeast Wet Prep HPF POC: NONE SEEN

## 2016-08-22 LAB — TSH: TSH: 2.79 mIU/L

## 2016-08-22 MED ORDER — METRONIDAZOLE 500 MG PO TABS: 500.0000 mg | ORAL_TABLET | Freq: Two times a day (BID) | ORAL | 0 refills | Status: DC

## 2016-08-22 MED ORDER — TOLTERODINE TARTRATE ER 2 MG PO CP24: 2.0000 mg | ORAL_CAPSULE | Freq: Every day | ORAL | 11 refills | Status: DC

## 2016-08-22 NOTE — Progress Notes (Signed)
[name] 08/08/1965 [id number]   History:    51 y.o.  for annual gyn exam who complaints of urinary frequency where she goes several times during the day regardless of her fluid intake and sometimes will have urgency incontinence. This is been going on for several months. She has been followed by the gastroenterologist as a result of her abdominal bloating has had extensive evaluation to include upper and lower endoscopy. 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  Past medical history,surgical history, family history and social history were all reviewed and documented in the EPIC chart.  Gynecologic History Patient's last menstrual period was 07/26/2016. Contraception: condoms Last Pap: 2014. Results were: normal Last mammogram: 2015. Results were: normal  Obstetric History OB History  Gravida Para Term Preterm AB Living  6 2     2 2   SAB TAB Ectopic Multiple Live Births  2            # Outcome Date GA Lbr Len/2nd Weight Sex Delivery Anes PTL Lv  6 SAB           5 SAB           4 Para           3 Para           2 Gravida           1 Gravida                ROS: A ROS was performed and pertinent positives and negatives are included in the history.  GENERAL: No fevers or chills. HEENT: No change in vision, no earache, sore throat or sinus congestion. NECK: No pain or stiffness. CARDIOVASCULAR: No chest pain or pressure. No palpitations. PULMONARY: No shortness of breath, cough or wheeze. GASTROINTESTINAL: No abdominal pain, nausea, vomiting or diarrhea, melena or bright red blood per rectum. GENITOURINARY: No urinary frequency, urgency, hesitancy or dysuria. MUSCULOSKELETAL: No joint or  muscle pain, no back pain, no recent trauma. DERMATOLOGIC: No rash, no itching, no lesions. ENDOCRINE: No polyuria, polydipsia, no heat or cold intolerance. No recent change in weight. HEMATOLOGICAL: No anemia or easy bruising or bleeding. NEUROLOGIC: No headache, seizures, numbness, tingling or weakness. PSYCHIATRIC: No depression, no loss of interest in normal activity or change in sleep pattern.     Exam: chaperone present  BP 116/76   Ht 5\' 8"  (1.727 m)   Wt 209 lb (94.8 kg)   LMP 07/26/2016   BMI 31.78 kg/m   Body mass index is 31.78 kg/m.  General appearance : Well developed well nourished female. No acute distress HEENT: Eyes: no retinal hemorrhage or exudates,  Neck supple, trachea midline, no carotid bruits, no thyroidmegaly Lungs: Clear to auscultation, no rhonchi or wheezes, or rib retractions  Heart: Regular rate and rhythm, no murmurs or gallops Breast:Examined in sitting and supine position were symmetrical in appearance, no palpable masses or tenderness,  no skin retraction, no nipple inversion, no nipple discharge, no skin discoloration, no axillary or supraclavicular lymphadenopathy Abdomen: no palpable masses or tenderness, no rebound or guarding Extremities: no edema or skin discoloration or tenderness  Pelvic:  Bartholin, Urethra, Skene Glands: Within normal limits  Vagina: No gross lesions or discharge  Cervix: Foul odor tampon found retrieved and discarded  Uterus  anteverted, normal size, shape and consistency, non-tender and mobile  Adnexa  Without masses or tenderness  Anus and perineum  normal   Rectovaginal  normal sphincter tone without palpated masses or tenderness             Hemoccult not indicated   After the tampon was removed a wet prep was done as a result of the following are and many clue cells with moderate white blood cells and too numerous to count bacteria were noted  Assessment/Plan:  51 y.o. female for annual exam with several  issues: #1 foreign body/tampon removed from vagina prescription for Flagyl 500 mg one by mouth twice a day for 7 days for bacterial vaginosis since patient is given rated start on her menstrual cycle will not prescribed vaginal cream or gel. 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 Risk benefits and pros and cons of the medication were discussed patient and literature information was provided. #3 her fasting blood work will be drawn today consisting of comprehensive metabolic panel, fasting lipid profile, CBC and urinalysis. Her TSH will be drawn today as a result of her weight gain and urinalysis because of her urinary frequency. #4 Pap smear with HPV screening done today #5 requisition to schedule her overdue mammogram was provided  An additional 15 minutes decides annual exam was spent removing tampon discussing treatment for bacterial vaginosis as well as discussing overactive bladder is diagnosis and treatment.  Terrance Mass MD, 12:00 PM 08/22/2016

## 2016-08-22 NOTE — Patient Instructions (Addendum)
Overactive Bladder, Adult Overactive bladder is a group of urinary symptoms. With overactive bladder, you may suddenly feel the need to pass urine (urinate) right away. After feeling this sudden urge, you might also leak urine if you cannot get to the bathroom fast enough (urinary incontinence). These symptoms might interfere with your daily work or social activities. Overactive bladder symptoms may also wake you up at night. Overactive bladder affects the nerve signals between your bladder and your brain. Your bladder may get the signal to empty before it is full. Very sensitive muscles can also make your bladder squeeze too soon. What are the causes? Many things can cause an overactive bladder. Possible causes include:  Urinary tract infection.  Infection of nearby tissues, such as the prostate.  Prostate enlargement.  Being pregnant with twins or more (multiples).  Surgery on the uterus or urethra.  Bladder stones, inflammation, or tumors.  Drinking too much caffeine or alcohol.  Certain medicines, especially those that you take to help your body get rid of extra fluid (diuretics) by increasing urine production.  Muscle or nerve weakness, especially from:  A spinal cord injury.  Stroke.  Multiple sclerosis.  Parkinson disease.  Diabetes. This can cause a high urine volume that fills the bladder so quickly that the normal urge to urinate is triggered very strongly.  Constipation. A buildup of too much stool can put pressure on your bladder. What increases the risk? You may be at greater risk for overactive bladder if you:  Are an older adult.  Smoke.  Are going through menopause.  Have prostate problems.  Have a neurological disease, such as stroke, dementia, Parkinson disease, or multiple sclerosis (MS).  Eat or drink things that irritate the bladder. These include alcohol, spicy food, and caffeine.  Are overweight or obese. What are the signs or symptoms? The  signs and symptoms of an overactive bladder include:  Sudden, strong urges to urinate.  Leaking urine.  Urinating eight or more times per day.  Waking up to urinate two or more times per night. How is this diagnosed? Your health care provider may suspect overactive bladder based on your symptoms. The health care provider will do a physical exam and take your medical history. Blood or urine tests may also be done. For example, you might need to have a bladder function test to check how well you can hold your urine. You might also need to see a health care provider who specializes in the urinary tract (urologist). How is this treated? Treatment for overactive bladder depends on the cause of your condition and whether it is mild or severe. Certain treatments can be done in your health care provider's office or clinic. You can also make lifestyle changes at home. Options include: Behavioral Treatments   Biofeedback. A specialist uses sensors to help you become aware of your body's signals.  Keeping a daily log of when you need to urinate and what happens after the urge. This may help you manage your condition.  Bladder training. This helps you learn to control the urge to urinate by following a schedule that directs you to urinate at regular intervals (timed voiding). At first, you might have to wait a few minutes after feeling the urge. In time, you should be able to schedule bathroom visits an hour or more apart.  Kegel exercises. These are exercises to strengthen the pelvic floor muscles, which support the bladder. Toning these muscles can help you control urination, even if your bladder muscles  are overactive. A specialist will teach you how to do these exercises correctly. They require daily practice.  Weight loss. If you are obese or overweight, losing weight might relieve your symptoms of overactive bladder. Talk to your health care provider about losing weight and whether there is a  specific program or method that would work best for you.  Diet change. This might help if constipation is making your overactive bladder worse. Your health care provider or a dietitian can explain ways to change what you eat to ease constipation. You might also need to consume less alcohol and caffeine or drink other fluids at different times of the day.  Stopping smoking.  Wearing pads to absorb leakage while you wait for other treatments to take effect. Physical Treatments   Electrical stimulation. Electrodes send gentle pulses of electricity to strengthen the nerves or muscles that help to control the bladder. Sometimes, the electrodes are placed outside of the body. In other cases, they might be placed inside the body (implanted). This treatment can take several months to have an effect.  Supportive devices. Women may need a plastic device that fits into the vagina and supports the bladder (pessary). Medicines  Several medicines can help treat overactive bladder and are usually used along with other treatments. Some are injected into the muscles involved in urination. Others come in pill form. Your health care provider may prescribe:  Antispasmodics. These medicines block the signals that the nerves send to the bladder. This keeps the bladder from releasing urine at the wrong time.  Tricyclic antidepressants. These types of antidepressants also relax bladder muscles. Surgery   You may have a device implanted to help manage the nerve signals that indicate when you need to urinate.  You may have surgery to implant electrodes for electrical stimulation.  Sometimes, very severe cases of overactive bladder require surgery to change the shape of the bladder. Follow these instructions at home:  Take medicines only as directed by your health care provider.  Use any implants or a pessary as directed by your health care provider.  Make any diet or lifestyle changes that are recommended by  your health care provider. These might include:  Drinking less fluid or drinking at different times of the day. If you need to urinate often during the night, you may need to stop drinking fluids early in the evening.  Cutting down on caffeine or alcohol. Both can make an overactive bladder worse. Caffeine is found in coffee, tea, and sodas.  Doing Kegel exercises to strengthen muscles.  Losing weight if you need to.  Eating a healthy and balanced diet to prevent constipation.  Keep a journal or log to track how much and when you drink and also when you feel the need to urinate. This will help your health care provider to monitor your condition. Contact a health care provider if:  Your symptoms do not get better after treatment.  Your pain and discomfort are getting worse.  You have more frequent urges to urinate.  You have a fever. Get help right away if: You are not able to control your bladder at all. This information is not intended to replace advice given to you by your health care provider. Make sure you discuss any questions you have with your health care provider. Document Released: 03/12/2009 Document Revised: 10/22/2015 Document Reviewed: 10/09/2013 Elsevier Interactive Patient Education  2017 [employer]. Kegel Exercises Kegel exercises help strengthen the muscles that support the rectum, vagina, small intestine, bladder,  and uterus. Doing Kegel exercises can help:  Improve bladder and bowel control.  Improve sexual response.  Reduce problems and discomfort during pregnancy. Kegel exercises involve squeezing your pelvic floor muscles, which are the same muscles you squeeze when you try to stop the flow of urine. The exercises can be done while sitting, standing, or lying down, but it is best to vary your position. Phase 1 exercises 1. Squeeze your pelvic floor muscles tight. You should feel a tight lift in your rectal area. If you are a female, you should also feel a  tightness in your vaginal area. Keep your stomach, buttocks, and legs relaxed. 2. Hold the muscles tight for up to 10 seconds. 3. Relax your muscles. Repeat this exercise 50 times a day or as many times as told by your health care provider. Continue to do this exercise for at least 4-6 weeks or for as long as told by your health care provider. This information is not intended to replace advice given to you by your health care provider. Make sure you discuss any questions you have with your health care provider. Document Released: 05/02/2012 Document Revised: 01/09/2016 Document Reviewed: 04/05/2015 Elsevier Interactive Patient Education  2017 [employer] Inc.   Bacterial Vaginosis Bacterial vaginosis is a vaginal infection that occurs when the normal balance of bacteria in the vagina is disrupted. It results from an overgrowth of certain bacteria. This is the most common vaginal infection among women ages 52-44. Because bacterial vaginosis increases your risk for STIs (sexually transmitted infections), getting treated can help reduce your risk for chlamydia, gonorrhea, herpes, and HIV (human immunodeficiency virus). Treatment is also important for preventing complications in pregnant women, because this condition can cause an early (premature) delivery. What are the causes? This condition is caused by an increase in harmful bacteria that are normally present in small amounts in the vagina. However, the reason that the condition develops is not fully understood. What increases the risk? The following factors may make you more likely to develop this condition:  Having a new sexual partner or multiple sexual partners.  Having unprotected sex.  Douching.  Having an intrauterine device (IUD).  Smoking.  Drug and alcohol abuse.  Taking certain antibiotic medicines.  Being pregnant. You cannot get bacterial vaginosis from toilet seats, bedding, swimming pools, or contact with objects around  you. What are the signs or symptoms? Symptoms of this condition include:  Grey or white vaginal discharge. The discharge can also be watery or foamy.  A fish-like odor with discharge, especially after sexual intercourse or during menstruation.  Itching in and around the vagina.  Burning or pain with urination. Some women with bacterial vaginosis have no signs or symptoms. How is this diagnosed? This condition is diagnosed based on:  Your medical history.  A physical exam of the vagina.  Testing a sample of vaginal fluid under a microscope to look for a large amount of bad bacteria or abnormal cells. Your health care provider may use a cotton swab or a small wooden spatula to collect the sample. How is this treated? This condition is treated with antibiotics. These may be given as a pill, a vaginal cream, or a medicine that is put into the vagina (suppository). If the condition comes back after treatment, a second round of antibiotics may be needed. Follow these instructions at home: Medicines   Take over-the-counter and prescription medicines only as told by your health care provider.  Take or use your antibiotic as told by  your health care provider. Do not stop taking or using the antibiotic even if you start to feel better. General instructions   If you have a female sexual partner, tell her that you have a vaginal infection. She should see her health care provider and be treated if she has symptoms. If you have a female sexual partner, he does not need treatment.  During treatment:  Avoid sexual activity until you finish treatment.  Do not douche.  Avoid alcohol as directed by your health care provider.  Avoid breastfeeding as directed by your health care provider.  Drink enough water and fluids to keep your urine clear or pale yellow.  Keep the area around your vagina and rectum clean.  Wash the area daily with warm water.  Wipe yourself from front to back after  using the toilet.  Keep all follow-up visits as told by your health care provider. This is important. How is this prevented?  Do not douche.  Wash the outside of your vagina with warm water only.  Use protection when having sex. This includes latex condoms and dental dams.  Limit how many sexual partners you have. To help prevent bacterial vaginosis, it is best to have sex with just one partner (monogamous).  Make sure you and your sexual partner are tested for STIs.  Wear cotton or cotton-lined underwear.  Avoid wearing tight pants and pantyhose, especially during summer.  Limit the amount of alcohol that you drink.  Do not use any products that contain nicotine or tobacco, such as cigarettes and e-cigarettes. If you need help quitting, ask your health care provider.  Do not use illegal drugs. Where to find more information:  Centers for Disease Control and Prevention: AppraiserFraud.fi  American Sexual Health Association (ASHA): www.ashastd.org  [place] Department of Health and Financial controller, Office on Women's Health: DustingSprays.pl or SecuritiesCard.it Contact a health care provider if:  Your symptoms do not improve, even after treatment.  You have more discharge or pain when urinating.  You have a fever.  You have pain in your abdomen.  You have pain during sex.  You have vaginal bleeding between periods. Summary  Bacterial vaginosis is a vaginal infection that occurs when the normal balance of bacteria in the vagina is disrupted.  Because bacterial vaginosis increases your risk for STIs (sexually transmitted infections), getting treated can help reduce your risk for chlamydia, gonorrhea, herpes, and HIV (human immunodeficiency virus). Treatment is also important for preventing complications in pregnant women, because the condition can cause an early (premature) delivery.  This condition is treated with antibiotic  medicines. These may be given as a pill, a vaginal cream, or a medicine that is put into the vagina (suppository). This information is not intended to replace advice given to you by your health care provider. Make sure you discuss any questions you have with your health care provider. Document Released: 05/16/2005 Document Revised: 01/30/2016 Document Reviewed: 01/30/2016 Elsevier Interactive Patient Education  2017 [employer].

## 2016-08-22 NOTE — Addendum Note (Signed)
Addended by: Nelva Nay on: 08/22/2016 12:08 PM   Modules accepted: Orders

## 2016-08-23 LAB — URINALYSIS W MICROSCOPIC + REFLEX CULTURE
BILIRUBIN URINE: NEGATIVE
Casts: NONE SEEN [LPF]
Crystals: NONE SEEN [HPF]
GLUCOSE, UA: NEGATIVE
Hgb urine dipstick: NEGATIVE
Ketones, ur: NEGATIVE
LEUKOCYTES UA: NEGATIVE
NITRITE: NEGATIVE
Protein, ur: NEGATIVE
SPECIFIC GRAVITY, URINE: 1.021 (ref 1.001–1.035)
Yeast: NONE SEEN [HPF]
pH: 5.5 (ref 5.0–8.0)

## 2016-08-24 LAB — URINE CULTURE: ORGANISM ID, BACTERIA: NO GROWTH

## 2016-08-25 LAB — PAP, TP IMAGING W/ HPV RNA, RFLX HPV TYPE 16,18/45: HPV mRNA, High Risk: NOT DETECTED

## 2016-08-28 ENCOUNTER — Encounter (HOSPITAL_COMMUNITY): Payer: Self-pay

## 2016-08-28 ENCOUNTER — Emergency Department (HOSPITAL_COMMUNITY)
Admission: EM | Admit: 2016-08-28 | Discharge: 2016-08-28 | Disposition: A | Discharge status: Home / Self Care | Payer: Worker's Compensation | Attending: Emergency Medicine | Admitting: Emergency Medicine

## 2016-08-28 DIAGNOSIS — E119 Type 2 diabetes mellitus without complications: Secondary | ICD-10-CM | POA: Diagnosis not present

## 2016-08-28 DIAGNOSIS — W260XXA Contact with knife, initial encounter: Secondary | ICD-10-CM | POA: Insufficient documentation

## 2016-08-28 DIAGNOSIS — Y939 Activity, unspecified: Secondary | ICD-10-CM | POA: Diagnosis not present

## 2016-08-28 DIAGNOSIS — F17210 Nicotine dependence, cigarettes, uncomplicated: Secondary | ICD-10-CM | POA: Diagnosis not present

## 2016-08-28 DIAGNOSIS — S61412A Laceration without foreign body of left hand, initial encounter: Secondary | ICD-10-CM | POA: Diagnosis not present

## 2016-08-28 DIAGNOSIS — Y929 Unspecified place or not applicable: Secondary | ICD-10-CM | POA: Insufficient documentation

## 2016-08-28 DIAGNOSIS — Y990 Civilian activity done for income or pay: Secondary | ICD-10-CM | POA: Diagnosis not present

## 2016-08-28 MED ORDER — TETANUS-DIPHTH-ACELL PERTUSSIS 5-2.5-18.5 LF-MCG/0.5 IM SUSP
0.5000 mL | Freq: Once | INTRAMUSCULAR | Status: AC
  Administered 2016-08-28: 0.5 mL via INTRAMUSCULAR
  Filled 2016-08-28: qty 0.5

## 2016-08-28 MED ORDER — LIDOCAINE-EPINEPHRINE (PF) 2 %-1:200000 IJ SOLN
10.0000 mL | Freq: Once | INTRAMUSCULAR | Status: DC
  Filled 2016-08-28: qty 20

## 2016-08-28 NOTE — ED Notes (Signed)
This RN spoke to [employer] at [employer] (employer) per request of patient. They were informed that the sheet the patient brought with her does not state a need for UDS. Employer states they require one. Workman's Comp drug screen will be completed

## 2016-08-28 NOTE — ED Triage Notes (Signed)
Patient was at work and cut her hand with a knife. Bleeding controlled.

## 2016-08-28 NOTE — ED Provider Notes (Signed)
[facility] DEPT Provider Note   CSN: [id number] Arrival date & time: 08/28/16  1116     History   Chief Complaint Chief Complaint  Patient presents with  . Laceration    HPI [name] is a 51 y.o. female with a PMHx of gestational DM, IBS, and overactive bladder, who presents to the ED with complaints of small left palm laceration she sustained while cutting oranges at work around 10 AM approximately 4 hours prior to evaluation. Patient states that the bleeding is controlled with a bandage, she is not on any blood thinners. She has unsure of her last tetanus shot. She reports mild 5/10 constant sharp nonradiating pain around the wound, worse with palpation to the area, and with no specific treatments tried prior to arrival. She denies any numbness, tingling, focal weakness, loss of range of motion of the digits, lightheadedness or syncope, or any other complaints at this time.   The history is provided by the patient and medical records. No language interpreter was used.  Laceration   The incident occurred 3 to 5 hours ago. The laceration is located on the left hand. The laceration is 1 cm in size. The laceration mechanism was a a clean knife. The pain is at a severity of 5/10. The pain is mild. The pain has been constant since onset. She reports no foreign bodies present. Her tetanus status is unknown.    Past Medical History:  Diagnosis Date  . Diabetes mellitus without complication ([place])    GESTATIONAL    Patient Active Problem List   Diagnosis Date Noted  . OAB (overactive bladder) 08/22/2016  . Paratubal cyst 06/16/2014  . IBS (irritable bowel syndrome) 06/16/2014  . Insomnia 05/02/2013  . History of gestational diabetes 05/02/2013  . DYSPEPSIA 07/16/2010  . NAUSEA WITH VOMITING 07/16/2010    Past Surgical History:  Procedure Laterality Date  . ACHILLES TENDON REPAIR Left 2006  . CESAREAN SECTION     x2  . COLONOSCOPY    . UPPER GASTROINTESTINAL ENDOSCOPY     . WISDOM TOOTH EXTRACTION      OB History    Gravida Para Term Preterm AB Living   6 2     2 2    SAB TAB Ectopic Multiple Live Births   2               Home Medications    Prior to Admission medications   Medication Sig Start Date End Date Taking? Authorizing Provider  metroNIDAZOLE (FLAGYL) 500 MG tablet Take 1 tablet (500 mg total) by mouth 2 (two) times daily. For 7 days.  Avoid alcohol while taking 08/22/16   Terrance Mass, MD  tolterodine (DETROL LA) 2 MG 24 hr capsule Take 1 capsule (2 mg total) by mouth daily. 08/22/16   Terrance Mass, MD    Family History Family History  Problem Relation Age of Onset  . Hypertension Father   . Heart disease Father   . Gallbladder disease Mother     in her 47s  . Heart disease Mother   . Heart disease Brother   . Diabetes Maternal Grandmother   . Gallbladder disease Maternal Grandmother   . Colon cancer Neg Hx   . Colon polyps Neg Hx   . Kidney disease Neg Hx   . Esophageal cancer Neg Hx     Social History Social History  Substance Use Topics  . Smoking status: Current Some Day Smoker    Packs/day: 0.25  Years: 4.00    Types: Cigarettes  . Smokeless tobacco: Never Used     Comment: form given 04/02/15  . Alcohol use 0.0 oz/week     Comment: occas. socially     Allergies   Patient has no known allergies.   Review of Systems Review of Systems  Musculoskeletal: Positive for joint swelling (around lac) and myalgias.  Skin: Positive for wound.  Allergic/Immunologic: Negative for immunocompromised state.  Neurological: Negative for weakness, light-headedness and numbness.  Hematological: Does not bruise/bleed easily.  Psychiatric/Behavioral: Negative for confusion.   10 Systems reviewed and are negative for acute change except as noted in the HPI.   Physical Exam Updated Vital Signs BP (!) 146/93   Pulse 83   Temp 98.2 F (36.8 C)   Resp 16   Ht 5\' 8"  (1.727 m)   Wt 94.8 kg   LMP 08/28/2016   SpO2  98%   BMI 31.78 kg/m   Physical Exam  Constitutional: She is oriented to person, place, and time. Vital signs are normal. She appears well-developed and well-nourished.  Non-toxic appearance. No distress.  Afebrile, nontoxic, NAD  HENT:  Head: Normocephalic and atraumatic.  Mouth/Throat: Mucous membranes are normal.  Eyes: Conjunctivae and EOM are normal. Right eye exhibits no discharge. Left eye exhibits no discharge.  Neck: Normal range of motion. Neck supple.  Cardiovascular: Normal rate and intact distal pulses.   Pulmonary/Chest: Effort normal. No respiratory distress.  Abdominal: Normal appearance. She exhibits no distension.  Musculoskeletal: Normal range of motion.       Left hand: She exhibits tenderness, laceration and swelling. She exhibits normal range of motion, no bony tenderness, normal two-point discrimination, normal capillary refill and no deformity. Normal sensation noted. Normal strength noted.  L palm with very small fairly superficial ~1cm V-shaped laceration on the palmar ulnar aspect, no ongoing bleeding, wound bed visualized and with no retained FBs. Mild tenderness around the wound, but no focal bony TTP. FROM intact in all digits including at MCP, PIP, and DIP joints of pinky and ring fingers. No crepitus or deformity, mild swelling around the wound, no bruising. Strength and sensation grossly intact, distal pulses and cap refill intact, compartments soft.  SEE PICTURE BELOW  Neurological: She is alert and oriented to person, place, and time. She has normal strength. No sensory deficit.  Skin: Skin is warm and dry. Laceration noted. No rash noted.  L palm lac as mentioned above and pictured below  Psychiatric: She has a normal mood and affect. Her behavior is normal.  Nursing note and vitals reviewed.      ED Treatments / Results  Labs (all labs ordered are listed, but only abnormal results are displayed) Labs Reviewed - No data to display  EKG  EKG  Interpretation None       Radiology No results found.  Procedures .Marland KitchenLaceration Repair Date/Time: 08/28/2016 2:40 PM Performed by: Reece Agar Authorized by: Reece Agar   Consent:    Consent obtained:  Verbal   Consent given by:  Patient   Risks discussed:  Pain and infection   Alternatives discussed:  No treatment, observation and referral Anesthesia (see MAR for exact dosages):    Anesthesia method:  Local infiltration   Local anesthetic:  Lidocaine 2% WITH epi Laceration details:    Location:  Hand   Hand location:  L palm   Length (cm):  1   Depth (mm):  5 Repair type:    Repair type:  Simple Pre-procedure details:  Preparation:  Patient was prepped and draped in usual sterile fashion Exploration:    Hemostasis achieved with:  Direct pressure   Wound exploration: wound explored through full range of motion and entire depth of wound probed and visualized     Wound extent: no foreign bodies/material noted     Contaminated: no   Treatment:    Area cleansed with:  Saline   Amount of cleaning:  Standard   Irrigation solution:  Sterile saline Skin repair:    Repair method:  Sutures   Suture size:  5-0   Suture material:  Prolene   Suture technique:  Simple interrupted   Number of sutures:  1 Approximation:    Approximation:  Close   Vermilion border: well-aligned   Post-procedure details:    Dressing:  Antibiotic ointment and non-adherent dressing   Patient tolerance of procedure:  Tolerated well, no immediate complications   (including critical care time)  Medications Ordered in ED Medications  lidocaine-EPINEPHrine (XYLOCAINE W/EPI) 2 %-1:200000 (PF) injection 10 mL (not administered)  Tdap (BOOSTRIX) injection 0.5 mL (0.5 mLs Intramuscular Given 08/28/16 1432)     Initial Impression / Assessment and Plan / ED Course  I have reviewed the triage vital signs and the nursing notes.  Pertinent labs & imaging results that were available during my  care of the patient were reviewed by me and considered in my medical decision making (see chart for details).     51 y.o. female here with L palm laceration sustained earlier today while cutting oranges at work. Very small, ~1cm somewhat V-shaped, to the palmar aspect on the ulnar side. FROM intact in all digits, NVI with soft compartments, no FBs seen, no ongoing bleeding. No indication for imaging. Will update Tdap and likely place one suture in for quicker healing time. Declines pain meds. Will reassess shortly  2:51 PM Wound sutured with 5-0 prolene x1 stitch, hemostatic after suture. Wound care advised, doubt need for prophylactic abx. Tylenol/motrin/ice for pain.  F/up with PCP in 7-10 days for suture removal and recheck. I explained the diagnosis and have given explicit precautions to return to the ER including for any other new or worsening symptoms. The patient understands and accepts the medical plan as it's been dictated and I have answered their questions. Discharge instructions concerning home care and prescriptions have been given. The patient is STABLE and is discharged to home in good condition.    Final Clinical Impressions(s) / ED Diagnoses   Final diagnoses:  Laceration of left hand without foreign body, initial encounter    New Prescriptions New Prescriptions   No medications on file     [address], PA-C 08/28/16 [place], MD 08/28/16 [phone]

## 2016-08-28 NOTE — ED Notes (Signed)
CN was asked to speak with pt and husband due to a delay with UDS for workers comp. Pt obtained regular urine and could not urinate again for UDS. Both were very upset. Upon arrival to room pt upset husband verbally loud and yelling without allowing writer to speak or try to resolve problem. After a couple of minutes of being yelled at, writer tried to explain she would not tolerate, was not heard therefore walked out of room, call Misha AC to come, when she entered room same anger expressed, therefore she had to leave room also. Pt and husband left without UDS being obtained.

## 2016-08-28 NOTE — Discharge Instructions (Signed)
Keep wound clean with mild soap and water. Keep area covered with a topical antibiotic ointment and bandage, keep bandage dry, and do not submerge in water for 24 hours. Ice and elevate for additional pain relief and swelling. Alternate between Ibuprofen and Tylenol for additional pain relief. Follow up with your primary care doctor or the [facility] in approximately 7-10 days for wound recheck and suture removal. Monitor area for signs of infection to include, but not limited to: increasing pain, spreading redness, drainage/pus, worsening swelling, or fevers. Return to emergency department for emergent changing or worsening symptoms.

## 2016-08-28 NOTE — ED Notes (Signed)
This RN was asked by primary RN to speak with patient regarding UDS. When this RN did so, pt and husband repeatedly yelled at this RN regarding length of time in ED and about UDS not being completed. Pt's husband slammed belongings and paperwork in room angrily while this RN was trying to facilitate care. Husband was also heard yelling at NS outside the room. EDP and pt asked for someone to contact pt's employer to verify the need for a UDS as paperwork from employer did not list UDS as a requirement.

## 2016-08-28 NOTE — ED Notes (Signed)
Pt still waiting for workers comp. Drug test.

## 2016-10-10 ENCOUNTER — Other Ambulatory Visit: Payer: Self-pay | Admitting: *Deleted

## 2016-10-10 MED ORDER — TOLTERODINE TARTRATE ER 2 MG PO CP24: 2.0000 mg | ORAL_CAPSULE | Freq: Every day | ORAL | 3 refills | Status: DC

## 2016-10-12 ENCOUNTER — Encounter: Payer: Self-pay | Admitting: Gynecology

## 2017-04-13 ENCOUNTER — Ambulatory Visit: Payer: BLUE CROSS/BLUE SHIELD | Admitting: Physician Assistant

## 2017-04-13 ENCOUNTER — Encounter: Payer: Self-pay | Admitting: Physician Assistant

## 2017-04-13 VITALS — BP 150/84 | HR 84 | Ht 67.25 in | Wt 215.0 lb

## 2017-04-13 DIAGNOSIS — R3589 Other polyuria: Secondary | ICD-10-CM

## 2017-04-13 DIAGNOSIS — R140 Abdominal distension (gaseous): Secondary | ICD-10-CM

## 2017-04-13 DIAGNOSIS — R1013 Epigastric pain: Secondary | ICD-10-CM | POA: Diagnosis not present

## 2017-04-13 DIAGNOSIS — R6881 Early satiety: Secondary | ICD-10-CM

## 2017-04-13 DIAGNOSIS — R358 Other polyuria: Secondary | ICD-10-CM

## 2017-04-13 NOTE — Progress Notes (Signed)
Chief Complaint: Epigastric bloating, occasional nausea and reflux  Review of pertinent gastrointestinal problems: 1. GERD with mild reflux esophagitis: EGD 06/2010 Dr. Ardis Hughs 2. Change in bowels; colonoscopy 07/2014 Dr. Ardis Hughs: found 2 subCM polyps, one was SSA, recommended recall at 5 years. 3. Epigastric pain/bloating-EGD 12/16: Mild nonspecific distal gastritis, LA grade a reflux esophagitis, slightly irregular GE junction biopsy showing chronic inactive gastritis, patient sent for ultrasound 4.  Ultrasound 04/09/15-portions of pancreas obscured by gas, visualized portions of pancreas normal, liver echogenicity overall increased, likely due to hepatic steatosis, otherwise unremarkable 5.  HIDA scan with [facility] 05/04/15-normal, next recommended study was a gastric emptying study if symptoms continued  HPI:    [name] is a 51 year old Caucasian female with a past medical history as listed below, who has followed with Dr. Ardis Hughs in the past as above, who returns to clinic today with a continued complaint of epigastric bloating and occasional nausea and reflux.    Today, the patient tells me that her symptoms have not changed.  She continues to describe upper abdominal dull pains which can be tight and hard at times.  This seems somewhat worse with a meal.  It is definitively worse when she eats a very "large meal", which she has stopped doing.  Patient tells me that she will have occasional heartburn and reflux but this is typically only if she "eats too much".  She also describes occasional nausea.  None of the symptoms have changed since her last evaluation.  Patient is willing to undergo further studies to find out what is happening.    Patient also describes today that she is having no relief from her polyurea with Tolterodine 2 mg.  She tells me she has been on this medication for at least 6 months and would like to have a referral to urology to "find out why my bladder always feels like it is  full/never empty".  Patient describes having small amounts of urine multiple times a day.    Patient denies fever, chills, blood in her stool, melena, weight loss, fatigue, anorexia, vomiting or symptoms that awaken her at night.  Past Medical History:  Diagnosis Date  . Diabetes mellitus without complication ([place])    GESTATIONAL    Past Surgical History:  Procedure Laterality Date  . ACHILLES TENDON REPAIR Left 2006  . CESAREAN SECTION     x2  . COLONOSCOPY    . UPPER GASTROINTESTINAL ENDOSCOPY    . WISDOM TOOTH EXTRACTION      Current Outpatient Medications  Medication Sig Dispense Refill  . tolterodine (DETROL LA) 2 MG 24 hr capsule Take 1 capsule (2 mg total) by mouth daily. 90 capsule 3   No current facility-administered medications for this visit.     Allergies as of 04/13/2017  . (No Known Allergies)    Family History  Problem Relation Age of Onset  . Hypertension Father   . Heart disease Father   . Gallbladder disease Mother        in her 4s  . Heart disease Mother   . Heart disease Brother   . Diabetes Maternal Grandmother   . Gallbladder disease Maternal Grandmother   . Colon cancer Neg Hx   . Colon polyps Neg Hx   . Kidney disease Neg Hx   . Esophageal cancer Neg Hx     Social History   Socioeconomic History  . Marital status: Married    Spouse name: Not on file  . Number of children:  2  . Years of education: Not on file  . Highest education level: Not on file  Social Needs  . Financial resource strain: Not on file  . Food insecurity - worry: Not on file  . Food insecurity - inability: Not on file  . Transportation needs - medical: Not on file  . Transportation needs - non-medical: Not on file  Occupational History  . Occupation: [employer]  Tobacco Use  . Smoking status: Current Some Day Smoker    Packs/day: 0.25    Years: 4.00    Pack years: 1.00    Types: Cigarettes  . Smokeless tobacco: Never Used  . Tobacco comment: form given  04/02/15  Substance and Sexual Activity  . Alcohol use: Yes    Alcohol/week: 0.0 oz    Comment: occas. socially  . Drug use: No  . Sexual activity: Yes    Birth control/protection: Condom  Other Topics Concern  . Not on file  Social History Narrative  . Not on file    Review of Systems:    Constitutional: No weight loss, fever or chills Cardiovascular: No chest pain Respiratory: No SOB  Gastrointestinal: See HPI and otherwise negative   Physical Exam:  Vital signs: BP (!) 150/84 (BP Location: Left Arm, Patient Position: Sitting, Cuff Size: Normal)   Pulse 84   Ht 5' 7.25" (1.708 m)   Wt 215 lb (97.5 kg)   LMP 03/25/2017   BMI 33.42 kg/m   Constitutional:   Pleasant Caucasian female appears to be in NAD, Well developed, Well nourished, alert and cooperative Respiratory: Respirations even and unlabored. Lungs clear to auscultation bilaterally.   No wheezes, crackles, or rhonchi.  Cardiovascular: Normal S1, S2. No MRG. Regular rate and rhythm. No peripheral edema, cyanosis or pallor.  Gastrointestinal:  Tight epigastrum, mild ttp in epigastrum, mild distension, No rebound or guarding. Normal bowel sounds. No appreciable masses or hepatomegaly. Rectal:  Not performed.  Psychiatric:  Demonstrates good judgement and reason without abnormal affect or behaviors.  MOST RECENT LABS AND IMAGING: CBC    Component Value Date/Time   WBC 5.7 08/22/2016 1205   RBC 4.59 08/22/2016 1205   HGB 14.7 08/22/2016 1205   HCT 43.0 08/22/2016 1205   PLT 223 08/22/2016 1205   MCV 93.7 08/22/2016 1205   MCH 32.0 08/22/2016 1205   MCHC 34.2 08/22/2016 1205   RDW 13.3 08/22/2016 1205   LYMPHSABS 1,881 08/22/2016 1205   MONOABS 570 08/22/2016 1205   EOSABS 114 08/22/2016 1205   BASOSABS 0 08/22/2016 1205    CMP     Component Value Date/Time   NA 139 08/22/2016 1205   K 4.2 08/22/2016 1205   CL 109 08/22/2016 1205   CO2 24 08/22/2016 1205   GLUCOSE 106 (H) 08/22/2016 1205   BUN 10  08/22/2016 1205   CREATININE 0.76 08/22/2016 1205   CALCIUM 8.9 08/22/2016 1205   PROT 6.4 08/22/2016 1205   ALBUMIN 3.9 08/22/2016 1205   AST 13 08/22/2016 1205   ALT 13 08/22/2016 1205   ALKPHOS 52 08/22/2016 1205   BILITOT 0.5 08/22/2016 1205    Assessment: 1.  Epigastric pain: Patient continues with upper abdominal fullness and bloating, worse with very large meals, EGD, ultrasound and HIDA scan have been negative/normal in the past; consider gastroparesis versus possible SIBO 2.  Bloating: With above  Plan: 1.  Ordered Gastric emptying study today. 2.  Referred patient to Urology for polyuria per her request 3.  Briefly reviewed recommendations  for gastroparesis in case this is the patient's diagnosis. 4.  If gastric emptying study is negative could consider a breath test for SIBO.  Discussed this briefly with the patient. 5. Could also consider CT abdomen as this has not been pursued either 6.  Patient to follow in clinic per recommendations after gastric emptying study above.  Ellouise Newer, PA-C [facility] 04/13/2017, 2:46 PM

## 2017-04-13 NOTE — Progress Notes (Signed)
I agree with the above note, plan 

## 2017-04-13 NOTE — Patient Instructions (Addendum)
You have been scheduled for a gastric emptying scan at [facility] on 04/26/17 at 10 am. Please arrive at least 15 minutes prior to your appointment for registration. Please make certain not to have anything to eat or drink after midnight the night before your test. Hold all stomach medications (ex: Zofran, phenergan, Reglan) 48 hours prior to your test. If you need to reschedule your appointment, please contact radiology scheduling at [phone]. _____________________________________________________________________ A gastric-emptying study measures how long it takes for food to move through your stomach. There are several ways to measure stomach emptying. In the most common test, you eat food that contains a small amount of radioactive material. A scanner that detects the movement of the radioactive material is placed over your abdomen to monitor the rate at which food leaves your stomach. This test normally takes about 4 hours to complete. _____________________________________________________________________  We will call you with an appointment for [facility]. Dr. Gloriann Loan 05/25/17 at 10:45 am

## 2017-04-26 ENCOUNTER — Ambulatory Visit (HOSPITAL_COMMUNITY): Payer: BLUE CROSS/BLUE SHIELD

## 2017-04-28 ENCOUNTER — Ambulatory Visit (HOSPITAL_COMMUNITY)
Admission: RE | Admit: 2017-04-28 | Discharge: 2017-04-28 | Disposition: A | Discharge status: Home / Self Care | Payer: BLUE CROSS/BLUE SHIELD | Source: Ambulatory Visit | Attending: Physician Assistant | Admitting: Physician Assistant

## 2017-04-28 DIAGNOSIS — R6881 Early satiety: Secondary | ICD-10-CM | POA: Diagnosis present

## 2017-04-28 MED ORDER — TECHNETIUM TC 99M SULFUR COLLOID
2.0000 | Freq: Once | INTRAVENOUS | Status: AC | PRN
  Administered 2017-04-28: 2 via INTRAVENOUS

## 2017-05-02 DIAGNOSIS — R140 Abdominal distension (gaseous): Secondary | ICD-10-CM | POA: Insufficient documentation

## 2017-12-20 ENCOUNTER — Other Ambulatory Visit: Payer: Self-pay | Admitting: *Deleted

## 2017-12-21 ENCOUNTER — Other Ambulatory Visit: Payer: Self-pay

## 2018-04-17 ENCOUNTER — Telehealth (INDEPENDENT_AMBULATORY_CARE_PROVIDER_SITE_OTHER): Payer: Self-pay | Admitting: Orthopedic Surgery

## 2018-04-17 NOTE — Telephone Encounter (Signed)
Returned call to patient left message to return call to schedule appointment with Dr. Sharol Given

## 2018-04-24 ENCOUNTER — Ambulatory Visit (INDEPENDENT_AMBULATORY_CARE_PROVIDER_SITE_OTHER): Payer: BLUE CROSS/BLUE SHIELD | Admitting: Orthopedic Surgery

## 2018-05-03 ENCOUNTER — Ambulatory Visit (INDEPENDENT_AMBULATORY_CARE_PROVIDER_SITE_OTHER): Payer: BLUE CROSS/BLUE SHIELD | Admitting: Orthopedic Surgery

## 2018-05-03 ENCOUNTER — Ambulatory Visit (INDEPENDENT_AMBULATORY_CARE_PROVIDER_SITE_OTHER): Payer: Self-pay

## 2018-05-03 VITALS — Ht 68.0 in | Wt 200.0 lb

## 2018-05-03 DIAGNOSIS — M25562 Pain in left knee: Secondary | ICD-10-CM

## 2018-05-04 ENCOUNTER — Encounter (INDEPENDENT_AMBULATORY_CARE_PROVIDER_SITE_OTHER): Payer: Self-pay | Admitting: Orthopedic Surgery

## 2018-05-04 DIAGNOSIS — M25562 Pain in left knee: Secondary | ICD-10-CM

## 2018-05-04 MED ORDER — LIDOCAINE HCL 1 % IJ SOLN
5.0000 mL | INTRAMUSCULAR | Status: AC | PRN
  Administered 2018-05-04: 5 mL

## 2018-05-04 MED ORDER — METHYLPREDNISOLONE ACETATE 40 MG/ML IJ SUSP
40.0000 mg | INTRAMUSCULAR | Status: AC | PRN
Start: 2018-05-04 — End: 2018-05-04
  Administered 2018-05-04: 40 mg via INTRA_ARTICULAR

## 2018-05-04 NOTE — Progress Notes (Signed)
Office Visit Note   Patient: [name]           Date of Birth: 01-Jul-1965           MRN: [id number] Visit Date: 05/03/2018              Requested by: No referring provider defined for this encounter. PCP: Patient, No Pcp Per  Chief Complaint  Patient presents with  . Left Knee - Pain      HPI: Patient is a 52 year old woman who presents complaining of excruciating left knee pain.  She states she has some swelling she states that her knee flared up approximately 2 weeks ago.  She states she feels like her pain is secondary to standing on her feet at work.  She states she has had cortisone injections in the past that has worked.  Patient states she injured her knee at work in 2017 she states she takes Aleve as needed she states that eases mild pain but does nothing for her severe pain.  Assessment & Plan: Visit Diagnoses:  1. Left knee pain, unspecified chronicity     Plan: Knee was injected she tolerated this well we will follow-up as needed discussed the importance of quad VMO strengthening.  Follow-Up Instructions: Return if symptoms worsen or fail to improve.   Ortho Exam  Patient is alert, oriented, no adenopathy, well-dressed, normal affect, normal respiratory effort. Examination patient has a normal gait there is no effusion.  Collaterals and cruciates are stable patella femoral joint has some crepitation range of motion medial lateral joint line are nontender to palpation.  Imaging: No results found. No images are attached to the encounter.  Labs: Lab Results  Component Value Date   LABORGA NO GROWTH 08/22/2016     Lab Results  Component Value Date   ALBUMIN 3.9 08/22/2016   ALBUMIN 4.1 06/02/2014   ALBUMIN 4.3 05/02/2013    Body mass index is 30.41 kg/m.  Orders:  Orders Placed This Encounter  Procedures  . Large Joint Inj  . XR Knee 1-2 Views Left   No orders of the defined types were placed in this encounter.    Procedures: Large Joint  Inj: L knee on 05/04/2018 5:08 PM Indications: pain and diagnostic evaluation Details: 22 G 1.5 in needle, anteromedial approach  Arthrogram: No  Medications: 5 mL lidocaine 1 %; 40 mg methylPREDNISolone acetate 40 MG/ML Outcome: tolerated well, no immediate complications Procedure, treatment alternatives, risks and benefits explained, specific risks discussed. Consent was given by the patient. Immediately prior to procedure a time out was called to verify the correct patient, procedure, equipment, support staff and site/side marked as required. Patient was prepped and draped in the usual sterile fashion.      Clinical Data: No additional findings.  ROS:  All other systems negative, except as noted in the HPI. Review of Systems  Objective: Vital Signs: Ht 5\' 8"  (1.727 m)   Wt 200 lb (90.7 kg)   BMI 30.41 kg/m   Specialty Comments:  No specialty comments available.  PMFS History: Patient Active Problem List   Diagnosis Date Noted  . Bloating 05/02/2017  . OAB (overactive bladder) 08/22/2016  . Paratubal cyst 06/16/2014  . IBS (irritable bowel syndrome) 06/16/2014  . Insomnia 05/02/2013  . History of gestational diabetes 05/02/2013  . DYSPEPSIA 07/16/2010  . NAUSEA WITH VOMITING 07/16/2010   Past Medical History:  Diagnosis Date  . Diabetes mellitus without complication ([place])    GESTATIONAL  Family History  Problem Relation Age of Onset  . Hypertension Father   . Heart disease Father   . Gallbladder disease Mother        in her 27s  . Heart disease Mother   . Heart disease Brother   . Diabetes Maternal Grandmother   . Gallbladder disease Maternal Grandmother   . Colon cancer Neg Hx   . Colon polyps Neg Hx   . Kidney disease Neg Hx   . Esophageal cancer Neg Hx     Past Surgical History:  Procedure Laterality Date  . ACHILLES TENDON REPAIR Left 2006  . CESAREAN SECTION     x2  . COLONOSCOPY    . UPPER GASTROINTESTINAL ENDOSCOPY    . WISDOM TOOTH  EXTRACTION     Social History   Occupational History  . Occupation: [employer]  Tobacco Use  . Smoking status: Current Some Day Smoker    Packs/day: 0.25    Years: 4.00    Pack years: 1.00    Types: Cigarettes  . Smokeless tobacco: Never Used  . Tobacco comment: form given 04/02/15  Substance and Sexual Activity  . Alcohol use: Yes    Alcohol/week: 0.0 standard drinks    Comment: occas. socially  . Drug use: No  . Sexual activity: Yes    Birth control/protection: Condom

## 2018-05-30 HISTORY — PX: KNEE SURGERY: SHX244

## 2018-07-09 ENCOUNTER — Encounter: Payer: Self-pay | Admitting: Obstetrics & Gynecology

## 2018-07-09 ENCOUNTER — Ambulatory Visit: Payer: BLUE CROSS/BLUE SHIELD | Admitting: Obstetrics & Gynecology

## 2018-07-09 VITALS — BP 128/80 | Ht 67.75 in | Wt 216.0 lb

## 2018-07-09 DIAGNOSIS — Z789 Other specified health status: Secondary | ICD-10-CM | POA: Diagnosis not present

## 2018-07-09 DIAGNOSIS — Z1151 Encounter for screening for human papillomavirus (HPV): Secondary | ICD-10-CM | POA: Diagnosis not present

## 2018-07-09 DIAGNOSIS — Z01419 Encounter for gynecological examination (general) (routine) without abnormal findings: Secondary | ICD-10-CM

## 2018-07-09 DIAGNOSIS — E6609 Other obesity due to excess calories: Secondary | ICD-10-CM | POA: Diagnosis not present

## 2018-07-09 DIAGNOSIS — Z6833 Body mass index (BMI) 33.0-33.9, adult: Secondary | ICD-10-CM

## 2018-07-09 NOTE — Progress Notes (Signed)
[name] [birth year] [id number]   History:    53 y.o. [id number]  Married  RP:  Established patient presenting for annual gyn exam   HPI:  Menses regular normal every month.  No BTB.  No pelvic pain.  No pain with IC.  Breasts normal.  BMI 33.09.  Needs to exercise more.  Will do fasting labs here.  Past medical history,surgical history, family history and social history were all reviewed and documented in the EPIC chart.  Gynecologic History Patient's last menstrual period was 06/18/2018. Contraception: condoms Last Pap: 07/2016. Results were: Negative/HPV HR neg Last mammogram: 05/2013. Results were: Negative Bone Density: Never Colonoscopy: 2016   Obstetric History OB History  Gravida Para Term Preterm AB Living  _0 SAB TAB Ectopic Multiple Live Births  2            # Outcome Date GA Lbr Len/2nd Weight Sex Delivery Anes PTL Lv  6 SAB           5 SAB           4 Para           3 Para           2 Gravida           1 Gravida              ROS: A ROS was performed and pertinent positives and negatives are included in the history.  GENERAL: No fevers or chills. HEENT: No change in vision, no earache, sore throat or sinus congestion. NECK: No pain or stiffness. CARDIOVASCULAR: No chest pain or pressure. No palpitations. PULMONARY: No shortness of breath, cough or wheeze. GASTROINTESTINAL: No abdominal pain, nausea, vomiting or diarrhea, melena or bright red blood per rectum. GENITOURINARY: No urinary frequency, urgency, hesitancy or dysuria. MUSCULOSKELETAL: No joint or muscle pain, no back pain, no recent trauma. DERMATOLOGIC: No rash, no itching, no lesions. ENDOCRINE: No polyuria, polydipsia, no heat or cold intolerance. No recent change in weight. HEMATOLOGICAL: No anemia or easy bruising or bleeding. NEUROLOGIC: No headache, seizures, numbness, tingling or weakness. PSYCHIATRIC: No depression, no loss of interest in normal activity or change in sleep pattern.       Exam:   Ht 5' 7.75" (1.721 m)   Wt 216 lb (98 kg)   LMP 06/18/2018   BMI 33.09 kg/m   Body mass index is 33.09 kg/m.  General appearance : Well developed well nourished female. No acute distress HEENT: Eyes: no retinal hemorrhage or exudates,  Neck supple, trachea midline, no carotid bruits, no thyroidmegaly Lungs: Clear to auscultation, no rhonchi or wheezes, or rib retractions  Heart: Regular rate and rhythm, no murmurs or gallops Breast:Examined in sitting and supine position were symmetrical in appearance, no palpable masses or tenderness,  no skin retraction, no nipple inversion, no nipple discharge, no skin discoloration, no axillary or supraclavicular lymphadenopathy Abdomen: no palpable masses or tenderness, no rebound or guarding Extremities: no edema or skin discoloration or tenderness  Pelvic: Vulva: Normal             Vagina: No gross lesions or discharge  Cervix: No gross lesions or discharge. Pap/HPV HR done.  Uterus  AV, normal size, shape and consistency, non-tender and mobile  Adnexa  Without masses or tenderness  Anus: Normal   Assessment/Plan:  53 y.o. female for annual exam   1. Encounter for routine gynecological examination with Papanicolaou smear  of cervix Normal gynecologic exam.  Pap with high-risk HPV done today.  Breast exam normal.  Last mammogram January 2015.  Patient will schedule a screening mammogram now at the breast center.  Fasting for health labs here today. - CBC - Comp Met (CMET) - Lipid panel - TSH - VITAMIN D 25 Hydroxy (Vit-D Deficiency, Fractures)  2. Use of condoms for contraception Desires to continue with condoms.  3. Class 1 obesity due to excess calories without serious comorbidity with body mass index (BMI) of 33.0 to 33.9 in adult Lower calorie/carb diet such as [employer] recommended.  Aerobic physical activities 5 times a week with weight lifting every 2 days.  Princess Bruins MD, 10:40 AM 07/09/2018

## 2018-07-10 LAB — CBC
HCT: 42 % (ref 35.0–45.0)
Hemoglobin: 14.6 g/dL (ref 11.7–15.5)
MCH: 32.5 pg (ref 27.0–33.0)
MCHC: 34.8 g/dL (ref 32.0–36.0)
MCV: 93.5 fL (ref 80.0–100.0)
MPV: 10.2 fL (ref 7.5–12.5)
PLATELETS: 236 10*3/uL (ref 140–400)
RBC: 4.49 10*6/uL (ref 3.80–5.10)
RDW: 12.3 % (ref 11.0–15.0)
WBC: 5 10*3/uL (ref 3.8–10.8)

## 2018-07-10 LAB — COMPREHENSIVE METABOLIC PANEL
AG Ratio: 1.5 (calc) (ref 1.0–2.5)
ALBUMIN MSPROF: 4 g/dL (ref 3.6–5.1)
ALT: 17 U/L (ref 6–29)
AST: 14 U/L (ref 10–35)
Alkaline phosphatase (APISO): 51 U/L (ref 37–153)
BILIRUBIN TOTAL: 0.5 mg/dL (ref 0.2–1.2)
BUN: 14 mg/dL (ref 7–25)
CO2: 24 mmol/L (ref 20–32)
CREATININE: 0.81 mg/dL (ref 0.50–1.05)
Calcium: 8.9 mg/dL (ref 8.6–10.4)
Chloride: 105 mmol/L (ref 98–110)
Globulin: 2.6 g/dL (calc) (ref 1.9–3.7)
Glucose, Bld: 106 mg/dL — ABNORMAL HIGH (ref 65–99)
POTASSIUM: 4.2 mmol/L (ref 3.5–5.3)
Sodium: 139 mmol/L (ref 135–146)
Total Protein: 6.6 g/dL (ref 6.1–8.1)

## 2018-07-10 LAB — LIPID PANEL
CHOLESTEROL: 171 mg/dL (ref <200)
HDL: 46 mg/dL — AB (ref >=50)
LDL CHOLESTEROL (CALC): 105 mg/dL — AB
Non-HDL Cholesterol (Calc): 125 mg/dL (calc) (ref <130)
TRIGLYCERIDES: 106 mg/dL (ref <150)
Total CHOL/HDL Ratio: 3.7 (calc) (ref <5.0)

## 2018-07-10 LAB — TSH: TSH: 2.1 mIU/L

## 2018-07-10 LAB — VITAMIN D 25 HYDROXY (VIT D DEFICIENCY, FRACTURES): Vit D, 25-Hydroxy: 19 ng/mL — ABNORMAL LOW (ref 30–100)

## 2018-07-11 LAB — PAP, TP IMAGING W/ HPV RNA, RFLX HPV TYPE 16,18/45: HPV DNA HIGH RISK: NOT DETECTED

## 2018-07-12 ENCOUNTER — Other Ambulatory Visit: Payer: Self-pay | Admitting: Obstetrics & Gynecology

## 2018-07-12 ENCOUNTER — Other Ambulatory Visit: Payer: Self-pay | Admitting: *Deleted

## 2018-07-12 DIAGNOSIS — E559 Vitamin D deficiency, unspecified: Secondary | ICD-10-CM

## 2018-07-12 MED ORDER — VITAMIN D (ERGOCALCIFEROL) 1.25 MG (50000 UNIT) PO CAPS: 50000.0000 [IU] | ORAL_CAPSULE | ORAL | 0 refills | Status: DC

## 2018-07-13 ENCOUNTER — Encounter: Payer: Self-pay | Admitting: Obstetrics & Gynecology

## 2018-07-13 NOTE — Patient Instructions (Addendum)
1. Encounter for routine gynecological examination with Papanicolaou smear of cervix Normal gynecologic exam.  Pap with high-risk HPV done today.  Breast exam normal.  Last mammogram January 2015.  Patient will schedule a screening mammogram now at the breast center.  Fasting for health labs here today. - CBC - Comp Met (CMET) - Lipid panel - TSH - VITAMIN D 25 Hydroxy (Vit-D Deficiency, Fractures)  2. Use of condoms for contraception Desires to continue with condoms.  3. Class 1 obesity due to excess calories without serious comorbidity with body mass index (BMI) of 33.0 to 33.9 in adult Lower calorie/carb diet such as [employer] recommended.  Aerobic physical activities 5 times a week with weight lifting every 2 days.  [name], it was a pleasure seeing you today!  I will inform you of your results as soon as they are available.

## 2018-07-23 ENCOUNTER — Encounter: Payer: BLUE CROSS/BLUE SHIELD | Admitting: Obstetrics & Gynecology

## 2018-08-09 ENCOUNTER — Encounter: Payer: BLUE CROSS/BLUE SHIELD | Admitting: Obstetrics & Gynecology

## 2018-09-28 ENCOUNTER — Other Ambulatory Visit: Payer: Self-pay | Admitting: Obstetrics & Gynecology

## 2018-10-30 ENCOUNTER — Telehealth: Payer: Self-pay

## 2018-10-30 NOTE — Telephone Encounter (Signed)
Called and left a VM for patient to CB to schedule an appointment with Dr. Sharol Given.

## 2018-11-01 ENCOUNTER — Ambulatory Visit: Payer: Self-pay

## 2018-11-01 ENCOUNTER — Other Ambulatory Visit: Payer: Self-pay

## 2018-11-01 ENCOUNTER — Ambulatory Visit (INDEPENDENT_AMBULATORY_CARE_PROVIDER_SITE_OTHER): Payer: BC Managed Care – PPO | Admitting: Physician Assistant

## 2018-11-01 ENCOUNTER — Encounter: Payer: Self-pay | Admitting: Orthopedic Surgery

## 2018-11-01 VITALS — Ht 67.0 in | Wt 216.0 lb

## 2018-11-01 DIAGNOSIS — M1711 Unilateral primary osteoarthritis, right knee: Secondary | ICD-10-CM

## 2018-11-01 DIAGNOSIS — M25561 Pain in right knee: Secondary | ICD-10-CM

## 2018-11-01 NOTE — Progress Notes (Signed)
Office Visit Note   Patient: [name]           Date of Birth: 02/11/1966           MRN: [id number] Visit Date: 11/01/2018              Requested by: No referring provider defined for this encounter. PCP: Patient, No Pcp Per  Chief Complaint  Patient presents with  . Right Knee - Pain      HPI: The patient is a 53 year old woman who presents with a several day history of soreness swelling and pain over the right knee.  She denies any history of trauma.  She reports that the pain is been quite severe at times and she would like a steroid injection if possible.  She is previously undergone left knee steroid injection and tolerated this well and felt like it helped.  Assessment & Plan: Visit Diagnoses:  1. Unilateral primary osteoarthritis, right knee   2. Acute pain of right knee     Plan: After informed consent the patient underwent a steroid injection to the right knee under sterile techniques and tolerated this well.  She was given information on exercises to strengthen her quadriceps muscle and will work on this over the next several weeks.  She will follow-up in 4 weeks or sooner should she have difficulties in the interim.  Follow-Up Instructions: Return in about 4 weeks (around 11/29/2018).   Ortho Exam  Patient is alert, oriented, no adenopathy, well-dressed, normal affect, normal respiratory effort. The patient ambulates with a mildly antalgic gait on the right.  Range of motion is full with minimal crepitus and complaints of pain over the anterior knee with range of motion.  There is no instability with varus valgus stress and negative drawers.  No signs of infection or cellulitis.  Mild effusion.   Imaging: No results found. No images are attached to the encounter.  Labs: Lab Results  Component Value Date   LABORGA NO GROWTH 08/22/2016     Lab Results  Component Value Date   ALBUMIN 3.9 08/22/2016   ALBUMIN 4.1 06/02/2014   ALBUMIN 4.3 05/02/2013    Body mass index is 33.83 kg/m.  Orders:  Orders Placed This Encounter  Procedures  . Large Joint Inj  . XR Knee 1-2 Views Right   No orders of the defined types were placed in this encounter.    Procedures: Large Joint Inj: R knee on 11/04/2018 10:50 AM Indications: pain and diagnostic evaluation Details: 22 G 1.5 in needle, anteromedial approach  Arthrogram: No  Medications: 5 mL lidocaine 1 %; 40 mg methylPREDNISolone acetate 40 MG/ML Outcome: tolerated well, no immediate complications Procedure, treatment alternatives, risks and benefits explained, specific risks discussed. Consent was given by the patient. Immediately prior to procedure a time out was called to verify the correct patient, procedure, equipment, support staff and site/side marked as required. Patient was prepped and draped in the usual sterile fashion.      Clinical Data: No additional findings.  ROS:  All other systems negative, except as noted in the HPI. Review of Systems  Objective: Vital Signs: Ht 5\' 7"  (1.702 m)   Wt 216 lb (98 kg)   BMI 33.83 kg/m   Specialty Comments:  No specialty comments available.  PMFS History: Patient Active Problem List   Diagnosis Date Noted  . Bloating 05/02/2017  . OAB (overactive bladder) 08/22/2016  . Paratubal cyst 06/16/2014  . IBS (irritable bowel syndrome) 06/16/2014  .  Insomnia 05/02/2013  . History of gestational diabetes 05/02/2013  . DYSPEPSIA 07/16/2010  . NAUSEA WITH VOMITING 07/16/2010   Past Medical History:  Diagnosis Date  . Diabetes mellitus without complication ([place])    GESTATIONAL    Family History  Problem Relation Age of Onset  . Hypertension Father   . Heart disease Father   . Gallbladder disease Mother        in her 63s  . Heart disease Mother   . Heart disease Brother   . Diabetes Maternal Grandmother   . Gallbladder disease Maternal Grandmother   . Colon cancer Neg Hx   . Colon polyps Neg Hx   . Kidney disease Neg Hx    . Esophageal cancer Neg Hx     Past Surgical History:  Procedure Laterality Date  . ACHILLES TENDON REPAIR Left 2006  . CESAREAN SECTION     x2  . COLONOSCOPY    . UPPER GASTROINTESTINAL ENDOSCOPY    . WISDOM TOOTH EXTRACTION     Social History   Occupational History  . Occupation: [employer]  Tobacco Use  . Smoking status: Current Some Day Smoker    Packs/day: 0.25    Years: 4.00    Pack years: 1.00    Types: Cigarettes  . Smokeless tobacco: Never Used  . Tobacco comment: form given 04/02/15  Substance and Sexual Activity  . Alcohol use: Yes    Alcohol/week: 0.0 standard drinks    Comment: occas. socially  . Drug use: No  . Sexual activity: Yes    Partners: Male    Birth control/protection: Condom    Comment: 1st intercourse- 65, partners- 61, married- 32  yrs

## 2018-11-04 ENCOUNTER — Encounter: Payer: Self-pay | Admitting: Physician Assistant

## 2018-11-04 DIAGNOSIS — M1711 Unilateral primary osteoarthritis, right knee: Secondary | ICD-10-CM | POA: Diagnosis not present

## 2018-11-04 MED ORDER — METHYLPREDNISOLONE ACETATE 40 MG/ML IJ SUSP
40.0000 mg | INTRAMUSCULAR | Status: AC | PRN
  Administered 2018-11-04: 40 mg via INTRA_ARTICULAR

## 2018-11-04 MED ORDER — LIDOCAINE HCL 1 % IJ SOLN
5.0000 mL | INTRAMUSCULAR | Status: AC | PRN
  Administered 2018-11-04: 5 mL

## 2018-11-08 ENCOUNTER — Encounter: Payer: Self-pay | Admitting: Orthopedic Surgery

## 2018-11-08 ENCOUNTER — Other Ambulatory Visit: Payer: Self-pay

## 2018-11-08 ENCOUNTER — Ambulatory Visit (INDEPENDENT_AMBULATORY_CARE_PROVIDER_SITE_OTHER): Payer: BC Managed Care – PPO | Admitting: Orthopedic Surgery

## 2018-11-08 VITALS — Ht 67.0 in | Wt 216.0 lb

## 2018-11-08 DIAGNOSIS — M1711 Unilateral primary osteoarthritis, right knee: Secondary | ICD-10-CM | POA: Diagnosis not present

## 2018-11-08 MED ORDER — PREDNISONE 10 MG PO TABS: 10.0000 mg | ORAL_TABLET | Freq: Every day | ORAL | 3 refills | Status: DC

## 2018-11-08 NOTE — Progress Notes (Signed)
Office Visit Note   Patient: [name]           Date of Birth: [birth year]           MRN: [id number] Visit Date: 11/08/2018              Requested by: No referring provider defined for this encounter. PCP: Patient, No Pcp Per  Chief Complaint  Patient presents with  . Right Knee - Follow-up    S/p injection 11/01/18      HPI: Patient is a 53 year old woman who presents in follow-up for osteoarthritis of her right knee she complains of crepitation with range of motion global pain swelling giving way she states she is almost fallen.  She complains of start up stiffness.  She states the steroid injection provided her no relief.  Assessment & Plan: Visit Diagnoses:  1. Unilateral primary osteoarthritis, right knee     Plan: We will try low-dose prednisone and see if this will help her symptoms.  Discussed that without relief with the steroid injection hyaluronic acid would most likely not help.  The fact that her pain is global with arthritic symptoms indicates that arthroscopy probably would not help either.  No focal mechanical symptoms no indication for an MRI scan.  Discussed the possibility of total knee arthroplasty if we cannot get her knees better with strengthening and anti-inflammatories.  Follow-Up Instructions: No follow-ups on file.   Ortho Exam  Patient is alert, oriented, no adenopathy, well-dressed, normal affect, normal respiratory effort. Examination patient has no effusion there is crepitation with range of motion of her knee she is tender to palpation the patellofemoral joint as well as medial and lateral joint line collaterals and cruciates are stable there is no indication of a meniscal tear.  Radiographs show osteophytic bone spurs primarily in the patellofemoral joint.  Medial lateral joint line has decreased joint space but are congruent  Imaging: No results found. No images are attached to the encounter.  Labs: Lab Results  Component Value Date   LABORGA NO GROWTH 08/22/2016     Lab Results  Component Value Date   ALBUMIN 3.9 08/22/2016   ALBUMIN 4.1 06/02/2014   ALBUMIN 4.3 05/02/2013    Body mass index is 33.83 kg/m.  Orders:  No orders of the defined types were placed in this encounter.  No orders of the defined types were placed in this encounter.    Procedures: No procedures performed  Clinical Data: No additional findings.  ROS:  All other systems negative, except as noted in the HPI. Review of Systems  Objective: Vital Signs: Ht 5\' 7"  (1.702 m)   Wt 216 lb (98 kg)   BMI 33.83 kg/m   Specialty Comments:  No specialty comments available.  PMFS History: Patient Active Problem List   Diagnosis Date Noted  . Bloating 05/02/2017  . OAB (overactive bladder) 08/22/2016  . Paratubal cyst 06/16/2014  . IBS (irritable bowel syndrome) 06/16/2014  . Insomnia 05/02/2013  . History of gestational diabetes 05/02/2013  . DYSPEPSIA 07/16/2010  . NAUSEA WITH VOMITING 07/16/2010   Past Medical History:  Diagnosis Date  . Diabetes mellitus without complication ([place])    GESTATIONAL    Family History  Problem Relation Age of Onset  . Hypertension Father   . Heart disease Father   . Gallbladder disease Mother        in her 38s  . Heart disease Mother   . Heart disease Brother   . Diabetes  Maternal Grandmother   . Gallbladder disease Maternal Grandmother   . Colon cancer Neg Hx   . Colon polyps Neg Hx   . Kidney disease Neg Hx   . Esophageal cancer Neg Hx     Past Surgical History:  Procedure Laterality Date  . ACHILLES TENDON REPAIR Left 2006  . CESAREAN SECTION     x2  . COLONOSCOPY    . UPPER GASTROINTESTINAL ENDOSCOPY    . WISDOM TOOTH EXTRACTION     Social History   Occupational History  . Occupation: [employer]  Tobacco Use  . Smoking status: Current Some Day Smoker    Packs/day: 0.25    Years: 4.00    Pack years: 1.00    Types: Cigarettes  . Smokeless tobacco: Never Used  .  Tobacco comment: form given 04/02/15  Substance and Sexual Activity  . Alcohol use: Yes    Alcohol/week: 0.0 standard drinks    Comment: occas. socially  . Drug use: No  . Sexual activity: Yes    Partners: Male    Birth control/protection: Condom    Comment: 1st intercourse- 45, partners- 33, married- 31  yrs

## 2018-11-19 ENCOUNTER — Encounter: Payer: Self-pay | Admitting: Orthopedic Surgery

## 2018-11-19 ENCOUNTER — Ambulatory Visit (INDEPENDENT_AMBULATORY_CARE_PROVIDER_SITE_OTHER): Payer: BC Managed Care – PPO | Admitting: Orthopedic Surgery

## 2018-11-19 ENCOUNTER — Other Ambulatory Visit: Payer: Self-pay

## 2018-11-19 VITALS — Ht 67.0 in | Wt 216.0 lb

## 2018-11-19 DIAGNOSIS — M1711 Unilateral primary osteoarthritis, right knee: Secondary | ICD-10-CM

## 2018-11-20 NOTE — Progress Notes (Signed)
Office Visit Note   Patient: [name]           Date of Birth: [birth year]           MRN: [id number] Visit Date: 11/19/2018              Requested by: No referring provider defined for this encounter. PCP: Patient, No Pcp Per  Chief Complaint  Patient presents with  . Right Knee - Follow-up, Pain      HPI: Patient is a 53 year old woman who presents for evaluation for osteoarthritis of her right knee.  Patient feels like she needs a knee replacement at this time she states her pain is a 4-5 over 10.  Assessment & Plan: Visit Diagnoses: No diagnosis found.  Plan: I have recommended physical therapy first.  Discussed that her arthritis does not seem that significant and we reviewed the potential complications with knee replacement.  Have recommended that she follow-up with [facility] for physical therapy.  Follow-Up Instructions: No follow-ups on file.   Ortho Exam  Patient is alert, oriented, no adenopathy, well-dressed, normal affect, normal respiratory effort. Examination patient has a normal gait.  There is no crepitation with range of motion the right knee collaterals and cruciates are stable she is globally tender to palpation around the knee there is no effusion no redness no cellulitis no signs of infection.  Her radiographs show some mild joint space narrowing but no significant arthritic changes.  Imaging: No results found. No images are attached to the encounter.  Labs: Lab Results  Component Value Date   LABORGA NO GROWTH 08/22/2016     Lab Results  Component Value Date   ALBUMIN 3.9 08/22/2016   ALBUMIN 4.1 06/02/2014   ALBUMIN 4.3 05/02/2013    Body mass index is 33.83 kg/m.  Orders:  No orders of the defined types were placed in this encounter.  No orders of the defined types were placed in this encounter.    Procedures: No procedures performed  Clinical Data: No additional findings.  ROS:  All other systems negative,  except as noted in the HPI. Review of Systems  Objective: Vital Signs: Ht 5\' 7"  (1.702 m)   Wt 216 lb (98 kg)   BMI 33.83 kg/m   Specialty Comments:  No specialty comments available.  PMFS History: Patient Active Problem List   Diagnosis Date Noted  . Bloating 05/02/2017  . OAB (overactive bladder) 08/22/2016  . Paratubal cyst 06/16/2014  . IBS (irritable bowel syndrome) 06/16/2014  . Insomnia 05/02/2013  . History of gestational diabetes 05/02/2013  . DYSPEPSIA 07/16/2010  . NAUSEA WITH VOMITING 07/16/2010   Past Medical History:  Diagnosis Date  . Diabetes mellitus without complication ([place])    GESTATIONAL    Family History  Problem Relation Age of Onset  . Hypertension Father   . Heart disease Father   . Gallbladder disease Mother        in her 57s  . Heart disease Mother   . Heart disease Brother   . Diabetes Maternal Grandmother   . Gallbladder disease Maternal Grandmother   . Colon cancer Neg Hx   . Colon polyps Neg Hx   . Kidney disease Neg Hx   . Esophageal cancer Neg Hx     Past Surgical History:  Procedure Laterality Date  . ACHILLES TENDON REPAIR Left 2006  . CESAREAN SECTION     x2  . COLONOSCOPY    . UPPER GASTROINTESTINAL ENDOSCOPY    .  WISDOM TOOTH EXTRACTION     Social History   Occupational History  . Occupation: [employer]  Tobacco Use  . Smoking status: Current Some Day Smoker    Packs/day: 0.25    Years: 4.00    Pack years: 1.00    Types: Cigarettes  . Smokeless tobacco: Never Used  . Tobacco comment: form given 04/02/15  Substance and Sexual Activity  . Alcohol use: Yes    Alcohol/week: 0.0 standard drinks    Comment: occas. socially  . Drug use: No  . Sexual activity: Yes    Partners: Male    Birth control/protection: Condom    Comment: 1st intercourse- 26, partners- 65, married- 80  yrs

## 2018-11-22 ENCOUNTER — Encounter: Payer: Self-pay | Admitting: Orthopedic Surgery

## 2018-11-28 ENCOUNTER — Other Ambulatory Visit: Payer: Self-pay | Admitting: Family

## 2018-11-29 ENCOUNTER — Telehealth: Payer: Self-pay | Admitting: Orthopedic Surgery

## 2018-11-29 ENCOUNTER — Other Ambulatory Visit: Payer: Self-pay | Admitting: Orthopedic Surgery

## 2018-11-29 ENCOUNTER — Ambulatory Visit: Payer: BC Managed Care – PPO | Admitting: Orthopedic Surgery

## 2018-11-29 MED ORDER — TRAMADOL HCL 50 MG PO TABS: 50.0000 mg | ORAL_TABLET | Freq: Four times a day (QID) | ORAL | 0 refills | Status: DC | PRN

## 2018-11-29 NOTE — Telephone Encounter (Signed)
Patient called and stated -needed a refill of Tramadol.  Please call patient @ [phone]

## 2018-11-29 NOTE — Telephone Encounter (Signed)
I called the pt and advised that the rx has been faxed to the pharm.

## 2018-11-29 NOTE — Telephone Encounter (Signed)
Pt is being treated for right knee OA. Was in the office 11/19/18 advised to try physical therapy. Pt is wanting an rx for Tramadol please advise.

## 2018-11-29 NOTE — Telephone Encounter (Signed)
rx went to [facility]

## 2018-12-17 ENCOUNTER — Ambulatory Visit: Payer: BC Managed Care – PPO | Admitting: Orthopedic Surgery

## 2019-01-10 ENCOUNTER — Ambulatory Visit: Payer: BC Managed Care – PPO | Admitting: Orthopedic Surgery

## 2019-04-01 ENCOUNTER — Ambulatory Visit: Payer: BC Managed Care – PPO | Admitting: Orthopedic Surgery

## 2019-04-08 ENCOUNTER — Ambulatory Visit: Payer: BC Managed Care – PPO | Admitting: Orthopedic Surgery

## 2019-07-15 ENCOUNTER — Encounter: Payer: BLUE CROSS/BLUE SHIELD | Admitting: Obstetrics & Gynecology

## 2019-07-17 ENCOUNTER — Other Ambulatory Visit: Payer: Self-pay

## 2019-07-18 ENCOUNTER — Encounter: Payer: Self-pay | Admitting: Obstetrics & Gynecology

## 2019-08-08 ENCOUNTER — Ambulatory Visit: Payer: BC Managed Care – PPO | Attending: Internal Medicine

## 2019-08-08 DIAGNOSIS — Z23 Encounter for immunization: Secondary | ICD-10-CM

## 2019-08-08 NOTE — Progress Notes (Signed)
   Covid-19 Vaccination Clinic  Name:  [name]    MRN: [id number] DOB: [birth year]  08/08/2019  [name] was observed post Covid-19 immunization for 15 minutes without incident. She was provided with Vaccine Information Sheet and instruction to access the V-Safe system.   [name] was instructed to call 911 with any severe reactions post vaccine: Marland Kitchen Difficulty breathing  . Swelling of face and throat  . A fast heartbeat  . A bad rash all over body  . Dizziness and weakness   Immunizations Administered    Name Date Dose VIS Date Route   Pfizer COVID-19 Vaccine 08/08/2019 11:19 AM 0.3 mL 05/10/2019 Intramuscular   Manufacturer: [place]   Lot: [id number]   [place]: [id number]

## 2019-08-22 ENCOUNTER — Other Ambulatory Visit: Payer: Self-pay

## 2019-08-23 ENCOUNTER — Ambulatory Visit: Payer: BC Managed Care – PPO | Admitting: Obstetrics & Gynecology

## 2019-08-23 ENCOUNTER — Encounter: Payer: Self-pay | Admitting: Obstetrics & Gynecology

## 2019-08-23 VITALS — BP 140/88 | Ht 67.5 in | Wt 216.0 lb

## 2019-08-23 DIAGNOSIS — Z6833 Body mass index (BMI) 33.0-33.9, adult: Secondary | ICD-10-CM

## 2019-08-23 DIAGNOSIS — E6609 Other obesity due to excess calories: Secondary | ICD-10-CM

## 2019-08-23 DIAGNOSIS — Z789 Other specified health status: Secondary | ICD-10-CM

## 2019-08-23 DIAGNOSIS — Z01419 Encounter for gynecological examination (general) (routine) without abnormal findings: Secondary | ICD-10-CM

## 2019-08-23 NOTE — Patient Instructions (Signed)
1. Well female exam with routine gynecological exam Normal gynecologic exam.  Pap test February 2020 was negative, no indication to repeat this year.  Breast exam normal.  Will schedule a screening mammogram now.  Colonoscopy to schedule this year.  Fasting health labs here today. - CBC - TSH - Comp Met (CMET) - Lipid panel - VITAMIN D 25 Hydroxy (Vit-D Deficiency, Fractures)  2. Use of condoms for contraception  3. Class 1 obesity due to excess calories without serious comorbidity with body mass index (BMI) of 33.0 to 33.9 in adult Recommend a lower calorie/carb diet such as [employer].  Aerobic activities 5 times a week and light weightlifting every 2 days.  Patient is very active at work.  [name], it was a pleasure seeing you today!  I will inform you of your results as soon as they are available.

## 2019-08-23 NOTE — Progress Notes (Signed)
SHAIMA SARDINAS 19-May-1966 [id number]   History:    54 y.o. [id number]  Married.  Works in [employer].  Sons 19 and 21.  RP:  Established patient presenting for annual gyn exam   HPI:  Menses regular normal every month. No BTB.  No pelvic pain.  No pain with IC.  Using condoms. Breasts normal.  BMI 33.33.  Needs to exercise more.  Will do fasting labs here today.   Past medical history,surgical history, family history and social history were all reviewed and documented in the EPIC chart.  Gynecologic History Patient's last menstrual period was 08/12/2019.  Obstetric History OB History  Gravida Para Term Preterm AB Living  _0 SAB TAB Ectopic Multiple Live Births  2            # Outcome Date GA Lbr Len/2nd Weight Sex Delivery Anes PTL Lv  6 SAB           5 SAB           4 Para           3 Para           2 Gravida           1 Gravida              ROS: A ROS was performed and pertinent positives and negatives are included in the history.  GENERAL: No fevers or chills. HEENT: No change in vision, no earache, sore throat or sinus congestion. NECK: No pain or stiffness. CARDIOVASCULAR: No chest pain or pressure. No palpitations. PULMONARY: No shortness of breath, cough or wheeze. GASTROINTESTINAL: No abdominal pain, nausea, vomiting or diarrhea, melena or bright red blood per rectum. GENITOURINARY: No urinary frequency, urgency, hesitancy or dysuria. MUSCULOSKELETAL: No joint or muscle pain, no back pain, no recent trauma. DERMATOLOGIC: No rash, no itching, no lesions. ENDOCRINE: No polyuria, polydipsia, no heat or cold intolerance. No recent change in weight. HEMATOLOGICAL: No anemia or easy bruising or bleeding. NEUROLOGIC: No headache, seizures, numbness, tingling or weakness. PSYCHIATRIC: No depression, no loss of interest in normal activity or change in sleep pattern.     Exam:   BP 140/88   Ht 5' 7.5" (1.715 m)   Wt 216 lb (98 kg)   LMP 08/12/2019   BMI 33.33  kg/m   Body mass index is 33.33 kg/m.  General appearance : Well developed well nourished female. No acute distress HEENT: Eyes: no retinal hemorrhage or exudates,  Neck supple, trachea midline, no carotid bruits, no thyroidmegaly Lungs: Clear to auscultation, no rhonchi or wheezes, or rib retractions  Heart: Regular rate and rhythm, no murmurs or gallops Breast:Examined in sitting and supine position were symmetrical in appearance, no palpable masses or tenderness,  no skin retraction, no nipple inversion, no nipple discharge, no skin discoloration, no axillary or supraclavicular lymphadenopathy Abdomen: no palpable masses or tenderness, no rebound or guarding Extremities: no edema or skin discoloration or tenderness  Pelvic: Vulva: Normal             Vagina: No gross lesions or discharge  Cervix: No gross lesions or discharge  Uterus  AV, normal size, shape and consistency, non-tender and mobile  Adnexa  Without masses or tenderness  Anus: Normal   Assessment/Plan:  54 y.o. female for annual exam   1. Well female exam with routine gynecological exam Normal gynecologic exam.  Pap test February 2020 was negative,  no indication to repeat this year.  Breast exam normal.  Will schedule a screening mammogram now.  Colonoscopy to schedule this year.  Fasting health labs here today. - CBC - TSH - Comp Met (CMET) - Lipid panel - VITAMIN D 25 Hydroxy (Vit-D Deficiency, Fractures)  2. Use of condoms for contraception  3. Class 1 obesity due to excess calories without serious comorbidity with body mass index (BMI) of 33.0 to 33.9 in adult Recommend a lower calorie/carb diet such as [employer].  Aerobic activities 5 times a week and light weightlifting every 2 days.  Patient is very active at work.  Princess Bruins MD, 4:32 PM 08/23/2019

## 2019-08-24 LAB — COMPREHENSIVE METABOLIC PANEL
AG Ratio: 1.8 (calc) (ref 1.0–2.5)
ALT: 19 U/L (ref 6–29)
AST: 16 U/L (ref 10–35)
Albumin: 4.2 g/dL (ref 3.6–5.1)
Alkaline phosphatase (APISO): 61 U/L (ref 37–153)
BUN: 16 mg/dL (ref 7–25)
CO2: 25 mmol/L (ref 20–32)
Calcium: 9.1 mg/dL (ref 8.6–10.4)
Chloride: 105 mmol/L (ref 98–110)
Creat: 0.73 mg/dL (ref 0.50–1.05)
Globulin: 2.3 g/dL (calc) (ref 1.9–3.7)
Glucose, Bld: 83 mg/dL (ref 65–99)
Potassium: 3.8 mmol/L (ref 3.5–5.3)
Sodium: 139 mmol/L (ref 135–146)
Total Bilirubin: 0.3 mg/dL (ref 0.2–1.2)
Total Protein: 6.5 g/dL (ref 6.1–8.1)

## 2019-08-24 LAB — CBC
HCT: 41.3 % (ref 35.0–45.0)
Hemoglobin: 14.1 g/dL (ref 11.7–15.5)
MCH: 32 pg (ref 27.0–33.0)
MCHC: 34.1 g/dL (ref 32.0–36.0)
MCV: 93.7 fL (ref 80.0–100.0)
MPV: 10.4 fL (ref 7.5–12.5)
Platelets: 264 Thousand/uL (ref 140–400)
RBC: 4.41 Million/uL (ref 3.80–5.10)
RDW: 12.3 % (ref 11.0–15.0)
WBC: 7.7 Thousand/uL (ref 3.8–10.8)

## 2019-08-24 LAB — VITAMIN D 25 HYDROXY (VIT D DEFICIENCY, FRACTURES): Vit D, 25-Hydroxy: 19 ng/mL — ABNORMAL LOW (ref 30–100)

## 2019-08-24 LAB — LIPID PANEL
Cholesterol: 188 mg/dL (ref <200)
HDL: 55 mg/dL (ref >=50)
LDL Cholesterol (Calc): 114 mg/dL (calc) — ABNORMAL HIGH
Non-HDL Cholesterol (Calc): 133 mg/dL (calc) — ABNORMAL HIGH (ref <130)
Total CHOL/HDL Ratio: 3.4 (calc) (ref <5.0)
Triglycerides: 86 mg/dL (ref <150)

## 2019-08-24 LAB — TSH: TSH: 2.09 mIU/L

## 2019-08-27 ENCOUNTER — Other Ambulatory Visit: Payer: Self-pay | Admitting: *Deleted

## 2019-08-27 DIAGNOSIS — E559 Vitamin D deficiency, unspecified: Secondary | ICD-10-CM

## 2019-08-27 MED ORDER — VITAMIN D (ERGOCALCIFEROL) 1.25 MG (50000 UNIT) PO CAPS: ORAL_CAPSULE | ORAL | 0 refills | Status: DC

## 2019-09-02 ENCOUNTER — Ambulatory Visit: Payer: BC Managed Care – PPO | Attending: Internal Medicine

## 2019-09-02 DIAGNOSIS — Z23 Encounter for immunization: Secondary | ICD-10-CM

## 2019-09-02 NOTE — Progress Notes (Signed)
   Covid-19 Vaccination Clinic  Name:  SHEENAH SLIFKA    MRN: [id number] DOB: [birth year]  09/02/2019  Ms. Niebrugge was observed post Covid-19 immunization for 15 minutes without incident. She was provided with Vaccine Information Sheet and instruction to access the V-Safe system.   Ms. Legault was instructed to call 911 with any severe reactions post vaccine: Marland Kitchen Difficulty breathing  . Swelling of face and throat  . A fast heartbeat  . A bad rash all over body  . Dizziness and weakness   Immunizations Administered    Name Date Dose VIS Date Route   Pfizer COVID-19 Vaccine 09/02/2019 10:27 AM 0.3 mL 05/10/2019 Intramuscular   Manufacturer: [place]   Lot: [id number]   [place]: [id number]

## 2020-05-11 HISTORY — PX: TOTAL KNEE ARTHROPLASTY: SHX125

## 2020-08-24 ENCOUNTER — Encounter: Payer: BC Managed Care – PPO | Admitting: Obstetrics & Gynecology

## 2020-09-08 ENCOUNTER — Ambulatory Visit (AMBULATORY_SURGERY_CENTER): Payer: Self-pay

## 2020-09-08 ENCOUNTER — Other Ambulatory Visit: Payer: Self-pay

## 2020-09-08 VITALS — Ht 67.5 in | Wt 215.0 lb

## 2020-09-08 DIAGNOSIS — Z86010 Personal history of colonic polyps: Secondary | ICD-10-CM

## 2020-09-08 NOTE — Progress Notes (Signed)

## 2020-09-17 ENCOUNTER — Encounter: Payer: Self-pay | Admitting: Gastroenterology

## 2020-09-22 ENCOUNTER — Encounter: Payer: Self-pay | Admitting: Gastroenterology

## 2020-09-22 ENCOUNTER — Other Ambulatory Visit: Payer: Self-pay | Admitting: Gastroenterology

## 2020-09-22 ENCOUNTER — Ambulatory Visit (AMBULATORY_SURGERY_CENTER): Payer: BC Managed Care – PPO | Admitting: Gastroenterology

## 2020-09-22 ENCOUNTER — Other Ambulatory Visit: Payer: Self-pay

## 2020-09-22 VITALS — BP 115/61 | HR 77 | Temp 97.7°F | Resp 15 | Ht 67.5 in | Wt 215.0 lb

## 2020-09-22 DIAGNOSIS — D123 Benign neoplasm of transverse colon: Secondary | ICD-10-CM

## 2020-09-22 DIAGNOSIS — D125 Benign neoplasm of sigmoid colon: Secondary | ICD-10-CM

## 2020-09-22 DIAGNOSIS — Z86010 Personal history of colonic polyps: Secondary | ICD-10-CM

## 2020-09-22 MED ORDER — SODIUM CHLORIDE 0.9 % IV SOLN: 500.0000 mL | Freq: Once | INTRAVENOUS | Status: AC

## 2020-09-22 NOTE — Patient Instructions (Signed)
YOU HAD AN ENDOSCOPIC PROCEDURE TODAY AT THE [facility]:   Refer to the procedure report that was given to you for any specific questions about what was found during the examination.  If the procedure report does not answer your questions, please call your gastroenterologist to clarify.  If you requested that your care partner not be given the details of your procedure findings, then the procedure report has been included in a sealed envelope for you to review at your convenience later.  YOU SHOULD EXPECT: Some feelings of bloating in the abdomen. Passage of more gas than usual.  Walking can help get rid of the air that was put into your GI tract during the procedure and reduce the bloating. If you had a lower endoscopy (such as a colonoscopy or flexible sigmoidoscopy) you may notice spotting of blood in your stool or on the toilet paper. If you underwent a bowel prep for your procedure, you may not have a normal bowel movement for a few days.  Please Note:  You might notice some irritation and congestion in your nose or some drainage.  This is from the oxygen used during your procedure.  There is no need for concern and it should clear up in a day or so.  SYMPTOMS TO REPORT IMMEDIATELY:  Following lower endoscopy (colonoscopy or flexible sigmoidoscopy):  Excessive amounts of blood in the stool  Significant tenderness or worsening of abdominal pains  Swelling of the abdomen that is new, acute  Fever of 100F or higher   For urgent or emergent issues, a gastroenterologist can be reached at any hour by calling [phone]. Do not use MyChart messaging for urgent concerns.    DIET:  We do recommend a small meal at first, but then you may proceed to your regular diet.  Drink plenty of fluids but you should avoid alcoholic beverages for 24 hours.  MEDICATIONS:  Continue present medications.  Please see handouts given to you by your recovery nurse.  Thank you for allowing us to  provide for your healthcare needs today.  ACTIVITY:  You should plan to take it easy for the rest of today and you should NOT DRIVE or use heavy machinery until tomorrow (because of the sedation medicines used during the test).    FOLLOW UP: Our staff will call the number listed on your records 48-72 hours following your procedure to check on you and address any questions or concerns that you may have regarding the information given to you following your procedure. If we do not reach you, we will leave a message.  We will attempt to reach you two times.  During this call, we will ask if you have developed any symptoms of COVID 19. If you develop any symptoms (ie: fever, flu-like symptoms, shortness of breath, cough etc.) before then, please call [phone].  If you test positive for Covid 19 in the 2 weeks post procedure, please call and report this information to us.    If any biopsies were taken you will be contacted by phone or by letter within the next 1-3 weeks.  Please call us at [phone] if you have not heard about the biopsies in 3 weeks.    SIGNATURES/CONFIDENTIALITY: You and/or your care partner have signed paperwork which will be entered into your electronic medical record.  These signatures attest to the fact that that the information above on your After Visit Summary has been reviewed and is understood.  Full responsibility of the   confidentiality of this discharge information lies with you and/or your care-partner.  

## 2020-09-22 NOTE — Progress Notes (Signed)
VS-KW  Pt's states no medical or surgical changes since previsit or office visit.  

## 2020-09-22 NOTE — Progress Notes (Signed)
Called to room to assist during endoscopic procedure.  Patient ID and intended procedure confirmed with present staff. Received instructions for my participation in the procedure from the performing physician.  

## 2020-09-22 NOTE — Progress Notes (Signed)
To PACU, VSS. Report to Rn.tb 

## 2020-09-22 NOTE — Op Note (Signed)
[facility] Patient Name: [name] Procedure Date: 09/22/2020 3:16 PM MRN: [id number] Endoscopist: Milus Banister , MD Age: 55 Referring MD:  Date of Birth: 05-Mar-1966 Gender: Female Account #: [id number] Procedure:                Colonoscopy Indications:              High risk colon cancer surveillance: Personal                            history of colonic polyps; Colonoscopy 2016 two                            subCM polyps, one HP and one SSP Medicines:                Monitored Anesthesia Care Procedure:                Pre-Anesthesia Assessment:                           - Prior to the procedure, a History and Physical                            was performed, and patient medications and                            allergies were reviewed. The patient's tolerance of                            previous anesthesia was also reviewed. The risks                            and benefits of the procedure and the sedation                            options and risks were discussed with the patient.                            All questions were answered, and informed consent                            was obtained. Prior Anticoagulants: The patient has                            taken no previous anticoagulant or antiplatelet                            agents. ASA Grade Assessment: II - A patient with                            mild systemic disease. After reviewing the risks                            and benefits, the patient was deemed in  satisfactory condition to undergo the procedure.                           After obtaining informed consent, the colonoscope                            was passed under direct vision. Throughout the                            procedure, the patient's blood pressure, pulse, and                            oxygen saturations were monitored continuously. The                            Olympus CF-[id number])  Colonoscope was                            introduced through the anus and advanced to the the                            cecum, identified by appendiceal orifice and                            ileocecal valve. The colonoscopy was performed                            without difficulty. The patient tolerated the                            procedure well. The quality of the bowel                            preparation was good. The ileocecal valve,                            appendiceal orifice, and rectum were photographed. Scope In: 3:27:31 PM Scope Out: 3:40:49 PM Scope Withdrawal Time: 0 hours 9 minutes 8 seconds  Total Procedure Duration: 0 hours 13 minutes 18 seconds  Findings:                 Two sessile polyps were found in the sigmoid colon                            and hepatic flexure. The polyps were 2 to 4 mm in                            size. These polyps were removed with a cold snare.                            Resection and retrieval were complete.                           The exam was otherwise without abnormality on  direct and retroflexion views. Complications:            No immediate complications. Estimated blood loss:                            None. Estimated Blood Loss:     Estimated blood loss: none. Impression:               - Two 2 to 4 mm polyps in the sigmoid colon and at                            the hepatic flexure, removed with a cold snare.                            Resected and retrieved.                           - The examination was otherwise normal on direct                            and retroflexion views. Recommendation:           - Patient has a contact number available for                            emergencies. The signs and symptoms of potential                            delayed complications were discussed with the                            patient. Return to normal activities tomorrow.                             Written discharge instructions were provided to the                            patient.                           - Resume previous diet.                           - Continue present medications.                           - Await pathology results. Milus Banister, MD 09/22/2020 3:44:18 PM This report has been signed electronically.

## 2020-09-24 ENCOUNTER — Telehealth: Payer: Self-pay

## 2020-09-24 NOTE — Telephone Encounter (Signed)
First attempt follow up call to pt, lm on vm 

## 2020-09-24 NOTE — Telephone Encounter (Signed)
  Follow up Call-  Call back number 09/22/2020  Post procedure Call Back phone  # [phone]  Permission to leave phone message Yes  Some recent data might be hidden     Patient questions:  Do you have a fever, pain , or abdominal swelling? No. Pain Score  0 *  Have you tolerated food without any problems? Yes.    Have you been able to return to your normal activities? Yes.    Do you have any questions about your discharge instructions: Diet   No. Medications  No. Follow up visit  No.  Do you have questions or concerns about your Care? No.  Actions: * If pain score is 4 or above: No action needed, pain <4.  1. Have you developed a fever since your procedure? no  2.   Have you had an respiratory symptoms (SOB or cough) since your procedure? no  3.   Have you tested positive for COVID 19 since your procedure no  4.   Have you had any family members/close contacts diagnosed with the COVID 19 since your procedure?  no   If yes to any of these questions please route to Joylene John, RN and Joella Prince, RN

## 2020-10-05 ENCOUNTER — Encounter: Payer: Self-pay | Admitting: Gastroenterology

## 2020-10-30 ENCOUNTER — Ambulatory Visit: Payer: BC Managed Care – PPO | Admitting: Obstetrics & Gynecology

## 2021-07-05 ENCOUNTER — Other Ambulatory Visit: Payer: Self-pay | Admitting: Physician Assistant

## 2021-07-05 DIAGNOSIS — Z1231 Encounter for screening mammogram for malignant neoplasm of breast: Secondary | ICD-10-CM

## 2021-07-29 ENCOUNTER — Ambulatory Visit
Admission: RE | Admit: 2021-07-29 | Discharge: 2021-07-29 | Disposition: A | Discharge status: Home / Self Care | Payer: BC Managed Care – PPO | Source: Ambulatory Visit | Attending: Physician Assistant | Admitting: Physician Assistant

## 2021-07-29 DIAGNOSIS — Z1231 Encounter for screening mammogram for malignant neoplasm of breast: Secondary | ICD-10-CM

## 2022-09-06 ENCOUNTER — Other Ambulatory Visit: Payer: Self-pay | Admitting: Physician Assistant

## 2022-09-06 DIAGNOSIS — Z1231 Encounter for screening mammogram for malignant neoplasm of breast: Secondary | ICD-10-CM

## 2023-02-03 ENCOUNTER — Ambulatory Visit
Admission: RE | Admit: 2023-02-03 | Discharge: 2023-02-03 | Disposition: A | Discharge status: Home / Self Care | Payer: BC Managed Care – PPO | Source: Ambulatory Visit | Attending: Physician Assistant | Admitting: Physician Assistant

## 2023-02-03 DIAGNOSIS — Z1231 Encounter for screening mammogram for malignant neoplasm of breast: Secondary | ICD-10-CM

## 2023-02-09 ENCOUNTER — Ambulatory Visit: Payer: BC Managed Care – PPO

## 2023-03-02 ENCOUNTER — Other Ambulatory Visit: Payer: Self-pay | Admitting: Physician Assistant

## 2023-03-02 DIAGNOSIS — S8992XA Unspecified injury of left lower leg, initial encounter: Secondary | ICD-10-CM

## 2023-08-16 ENCOUNTER — Emergency Department (HOSPITAL_COMMUNITY)

## 2023-08-16 ENCOUNTER — Encounter (HOSPITAL_COMMUNITY): Payer: Self-pay

## 2023-08-16 ENCOUNTER — Other Ambulatory Visit: Payer: Self-pay

## 2023-08-16 ENCOUNTER — Emergency Department (HOSPITAL_COMMUNITY)
Admission: EM | Admit: 2023-08-16 | Discharge: 2023-08-16 | Disposition: A | Discharge status: Home / Self Care | Attending: Emergency Medicine | Admitting: Emergency Medicine

## 2023-08-16 DIAGNOSIS — R0789 Other chest pain: Secondary | ICD-10-CM | POA: Insufficient documentation

## 2023-08-16 DIAGNOSIS — R079 Chest pain, unspecified: Secondary | ICD-10-CM

## 2023-08-16 LAB — TSH: TSH: 1.822 u[IU]/mL (ref 0.350–4.500)

## 2023-08-16 LAB — HEPATIC FUNCTION PANEL
ALT: 44 U/L (ref 0–44)
AST: 28 U/L (ref 15–41)
Albumin: 4 g/dL (ref 3.5–5.0)
Alkaline Phosphatase: 62 U/L (ref 38–126)
Bilirubin, Direct: 0.1 mg/dL (ref 0.0–0.2)
Indirect Bilirubin: 0.5 mg/dL (ref 0.3–0.9)
Total Bilirubin: 0.6 mg/dL (ref 0.0–1.2)
Total Protein: 6.8 g/dL (ref 6.5–8.1)

## 2023-08-16 LAB — CBC
HCT: 42.7 % (ref 36.0–46.0)
Hemoglobin: 14.5 g/dL (ref 12.0–15.0)
MCH: 31.8 pg (ref 26.0–34.0)
MCHC: 34 g/dL (ref 30.0–36.0)
MCV: 93.6 fL (ref 80.0–100.0)
Platelets: 257 10*3/uL (ref 150–400)
RBC: 4.56 MIL/uL (ref 3.87–5.11)
RDW: 12.8 % (ref 11.5–15.5)
WBC: 5.5 10*3/uL (ref 4.0–10.5)
nRBC: 0 % (ref 0.0–0.2)

## 2023-08-16 LAB — BASIC METABOLIC PANEL
Anion gap: 9 (ref 5–15)
BUN: 17 mg/dL (ref 6–20)
CO2: 25 mmol/L (ref 22–32)
Calcium: 9.2 mg/dL (ref 8.9–10.3)
Chloride: 103 mmol/L (ref 98–111)
Creatinine, Ser: 0.74 mg/dL (ref 0.44–1.00)
GFR, Estimated: >60 mL/min (ref >60)
Glucose, Bld: 108 mg/dL — ABNORMAL HIGH (ref 70–99)
Potassium: 3.8 mmol/L (ref 3.5–5.1)
Sodium: 137 mmol/L (ref 135–145)

## 2023-08-16 LAB — HCG, SERUM, QUALITATIVE: Preg, Serum: NEGATIVE

## 2023-08-16 LAB — MAGNESIUM: Magnesium: 1.9 mg/dL (ref 1.7–2.4)

## 2023-08-16 LAB — LIPASE, BLOOD: Lipase: 41 U/L (ref 11–51)

## 2023-08-16 LAB — TROPONIN I (HIGH SENSITIVITY)
Troponin I (High Sensitivity): <2 ng/L (ref <18)
Troponin I (High Sensitivity): 2 ng/L (ref <18)

## 2023-08-16 LAB — D-DIMER, QUANTITATIVE: D-Dimer, Quant: <0.27 ug{FEU}/mL (ref 0.00–0.50)

## 2023-08-16 NOTE — ED Triage Notes (Signed)
 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 At the time of triage pt still having pain but has been relieved.

## 2023-08-16 NOTE — ED Provider Triage Note (Signed)
 Emergency Medicine Provider Triage Evaluation Note  [name] , a 58 y.o. female  was evaluated in triage.  Pt complains of chest pain.  Review of Systems  Positive:  Negative:   Physical Exam  BP (!) 146/90 (BP Location: Left Arm)   Pulse 87   Temp (!) 97.5 F (36.4 C) (Oral)   Resp 18   Ht 5\' 7"  (1.702 m)   Wt 97.5 kg   SpO2 95%   BMI 33.67 kg/m  Gen:   Awake, no distress   Resp:  Normal effort  MSK:   Moves extremities without difficulty  Other:    Medical Decision Making  Medically screening exam initiated at 1:52 PM.  Appropriate orders placed.  [name] was informed that the remainder of the evaluation will be completed by another provider, this initial triage assessment does not replace that evaluation, and the importance of remaining in the ED until their evaluation is complete.  Patient with SOB, chest pain, and palpitations while sitting at work today. Patient still feels SOB. Chest pain lasted 1 min. Patient stating that she had a holter monitor a couple of weeks ago for similar symptoms but has not been told the results yet. No fever, cough, nausea, vomiting, diarrhea.   Valrie Hart F, [place] 08/16/23 1354

## 2023-08-16 NOTE — ED Provider Notes (Addendum)
 [place] EMERGENCY DEPARTMENT AT [facility] Provider Note   CSN: [id number] Arrival date & time: 08/16/23  1318     History  Chief Complaint  Patient presents with   Chest Pain    Shortness of breath    [name] is a 58 y.o. female.  The history is provided by the patient and medical records. No language interpreter was used.  Chest Pain Pain location:  Substernal area Pain quality: aching and dull   Pain quality: no tightness   Pain radiates to:  Does not radiate Timing:  Intermittent Progression:  Waxing and waning Chronicity:  New Relieved by:  Nothing Worsened by:  Nothing Ineffective treatments:  None tried Associated symptoms: palpitations and shortness of breath   Associated symptoms: no abdominal pain, no altered mental status, no back pain, no cough, no fatigue, no fever, no headache, no nausea, no syncope and no vomiting        Home Medications Prior to Admission medications   Medication Sig Start Date End Date Taking? Authorizing Provider  ibuprofen (ADVIL) 200 MG tablet Take 200 mg by mouth every 6 (six) hours as needed.    [provider]      Allergies    Patient has no known allergies.    Review of Systems   Review of Systems  Constitutional:  Negative for chills, fatigue and fever.  HENT:  Negative for congestion.   Respiratory:  Positive for chest tightness and shortness of breath. Negative for cough and wheezing.   Cardiovascular:  Positive for chest pain and palpitations. Negative for leg swelling and syncope.  Gastrointestinal:  Negative for abdominal pain, constipation, diarrhea, nausea and vomiting.  Genitourinary:  Negative for dysuria and flank pain.  Musculoskeletal:  Negative for back pain and neck pain.  Skin:  Negative for rash and wound.  Neurological:  Positive for light-headedness. Negative for headaches.  Psychiatric/Behavioral:  Negative for agitation.   All other systems reviewed and are  negative.   Physical Exam Updated Vital Signs BP (!) 146/90 (BP Location: Left Arm)   Pulse 87   Temp (!) 97.5 F (36.4 C) (Oral)   Resp 18   Ht 5\' 7"  (1.702 m)   Wt 97.5 kg   SpO2 95%   BMI 33.67 kg/m  Physical Exam Vitals and nursing note reviewed.  Constitutional:      General: She is not in acute distress.    Appearance: She is well-developed. She is not ill-appearing, toxic-appearing or diaphoretic.  HENT:     Head: Normocephalic and atraumatic.  Eyes:     Conjunctiva/sclera: Conjunctivae normal.     Pupils: Pupils are equal, round, and reactive to light.  Cardiovascular:     Rate and Rhythm: Normal rate and regular rhythm.     Heart sounds: Normal heart sounds. No murmur heard. Pulmonary:     Effort: Pulmonary effort is normal. No respiratory distress.     Breath sounds: No wheezing, rhonchi or rales.  Chest:     Chest wall: No tenderness.  Abdominal:     Palpations: Abdomen is soft.     Tenderness: There is no abdominal tenderness.  Musculoskeletal:        General: No swelling.     Cervical back: Neck supple.     Right lower leg: No edema.     Left lower leg: No edema.  Skin:    General: Skin is warm and dry.     Capillary Refill: Capillary refill takes  less than 2 seconds.  Neurological:     Mental Status: She is alert.  Psychiatric:        Mood and Affect: Mood normal.     ED Results / Procedures / Treatments   Labs (all labs ordered are listed, but only abnormal results are displayed) Labs Reviewed  BASIC METABOLIC PANEL - Abnormal; Notable for the following components:      Result Value   Glucose, Bld 108 (*)    All other components within normal limits  CBC  HCG, SERUM, QUALITATIVE  D-DIMER, QUANTITATIVE  HEPATIC FUNCTION PANEL  LIPASE, BLOOD  TSH  MAGNESIUM  TROPONIN I (HIGH SENSITIVITY)  TROPONIN I (HIGH SENSITIVITY)    EKG EKG Interpretation Date/Time:  Wednesday August 16 2023 13:30:59 EDT Ventricular Rate:  71 PR  Interval:  170 QRS Duration:  96 QT Interval:  397 QTC Calculation: 432 R Axis:   28  Text Interpretation: Sinus rhythm Abnormal R-wave progression, early transition Baseline wander in lead(s) II III aVF V6 no prior ECG for comparisomn No sTEMI Confirmed by Theda Belfast ([id number]) on 08/16/2023 2:35:18 PM  Radiology DG Chest 2 View Result Date: 08/16/2023 CLINICAL DATA:  Chest pain EXAM: CHEST - 2 VIEW COMPARISON:  None Available. FINDINGS: No consolidation, pneumothorax or effusion. No edema. Normal cardiopericardial silhouette. Degenerative changes along the spine. Overlapping gown snaps. IMPRESSION: No acute cardiopulmonary disease. Electronically Signed   By: Karen Kays M.D.   On: 08/16/2023 16:12    Procedures Procedures    Medications Ordered in ED Medications - No data to display  ED Course/ Medical Decision Making/ A&P                                 Medical Decision Making Amount and/or Complexity of Data Reviewed Labs: ordered.    [name] is a 58 y.o. female with a past medical history significant for GERD, previous knee surgery, and arthritis who presents with sudden onset pleuritic chest pain and shortness of breath with palpitations.  According to patient, a week or 2 ago she had an episode of some palpitations and shortness of breath but did not have much chest discomfort.  She reports that she was wearing a monitor but then completed.  She said that today while at work she had sudden onset of pleuritic chest tightness, pain, and shortness of breath.  She reports she was felt she was going to pass out and she was diaphoretic.  She reports she sat down that seem to help.  She said that her symptoms lasted for a while but improved on the way to the emergency department.  She denies any nausea or vomiting and denies constipation, diarrhea, or urinary changes.  She denies any new leg pain or leg swelling but she reports several months ago she did have meniscus surgery  on her left knee.  She also says that she flew to [place] within the last several weeks.  No history of DVT or PE but does have a strong history of cardiac disease.  She reports her brother had MI and her father had early MI in his 44s.  On exam, lungs are clear.  Chest is nontender.  There is no murmur.  Abdomen nontender.  Good pulses in extremities.  Legs are nontender and nonedematous initially.  Patient resting comfortably.  She is not tachycardic or tachypneic and is not hypoxic.  Patient at rest now.  EKG does not show STEMI.  Given the patient's symptoms, we will get D-dimer to look for thromboembolic disease, will get troponin and trended, will get other screening labs as well.  Will get x-ray.  Anticipate reassessment after workup to determine disposition.  If workup reassuring, anticipate she may be stable for discharge and outpatient cardiology follow-up.  4:31 PM EKG reassuring.  Troponin negative x 2.  D-dimer negative, doubt PE.  Hepatic function other labs overall reassuring.  With all the findings gather and we feel safe with her discharge home.  We do want her to follow-up with outpatient cardiology and she has extremely strict return precautions.  If symptoms were to change recur, or worsen, she would likely admission at that time.  Patient agrees with plan of care and will be discharged for outpatient follow-up.   Final Clinical Impression(s) / ED Diagnoses Final diagnoses:  Atypical chest pain  Nonspecific chest pain    Rx / DC Orders ED Discharge Orders          Ordered    Ambulatory referral to Cardiology        08/16/23 1632            Clinical Impression: 1. Atypical chest pain   2. Nonspecific chest pain     Disposition: Discharge  Condition: Good  I have discussed the results, Dx and Tx plan with the pt(& family if present). He/she/they expressed understanding and agree(s) with the plan. Discharge instructions discussed at great length. Strict  return precautions discussed and pt &/or family have verbalized understanding of the instructions. No further questions at time of discharge.    New Prescriptions   No medications on file    Follow Up: [facility] DEPT OF [place]. [facility] [address] [place] [address] [phone]    Shon Baton, Cordelia Poche [address] [place] [address] [phone]         Wilhelmena Zea, Canary Brim, MD 08/16/23 1725    Cliffie Gingras, Canary Brim, MD 08/16/23 1728

## 2023-08-16 NOTE — Discharge Instructions (Signed)
 Your history, exam and workup today was overall reassuring however given your family history and description of symptoms we do agree with outpatient cardiology follow-up.  Please call them to schedule the appointment although they may call you to schedule as well.  Your cardiac enzymes were negative both times we checked them and your blood clot test was negative as well.  The x-ray was reassuring without any acute abnormalities on it and the rest of your labs were also overall unremarkable.  Please rest and stay hydrated and follow-up with your primary doctor as well.  If any symptoms change or worsen acutely, please return to the nearest emergency department.

## 2023-08-17 NOTE — Progress Notes (Unsigned)
 Cardiology Office Note:  .   Date:  08/18/2023  ID:  [name], DOB 1965/10/29, MRN [id number] PCP: Drenda Freeze  [facility] Providers Cardiologist:  None { History of Present Illness: .    Chief Complaint  Patient presents with   Chest Pain    [name] is a 58 y.o. female with history of obesity who presents for the evaluation of chest pain at the request of Drenda Freeze. Seen ER 3/19 with CP and negative work-up.   Discussed the use of AI scribe software for clinical note transcription with the patient, who gave verbal consent to proceed.  History of Present Illness   [name] is a 58 year old female with hypertension who presents with chest pain. She was referred by the emergency room for evaluation of chest pain.  She experiences chest pain described as a pressure sensation, accompanied by palpitations and dyspnea. These episodes primarily occur at night when she lies down to sleep, lasting about 15 to 20 seconds and resolving spontaneously. A more intense episode occurred at work, with difficulty catching her breath and heart pounding, lasting about a minute, prompting her visit to the emergency room. The workup there was negative.  Hypertension has been managed with medication since August. She denies any history of myocardial infarction, cerebrovascular accident, or diabetes.  Menopausal symptoms, including hot flashes and sleep disturbances, are being managed with paroxetine. She reports difficulty losing weight. No peripheral edema. She has a history of sleep apnea.  Her family history is significant for cardiovascular disease, with her father having died from a myocardial infarction, and her mother and brother also affected.  She occasionally smokes and consumes alcohol. She works as a Production assistant, radio at [employer] and is married with two children.       T chol 171, TG 183, LDL 98, HDL 45     Problem List Obesity  -BMI 33 2.  Hypertension    ROS: All other ROS reviewed and negative. Pertinent positives noted in the HPI.     Studies Reviewed: Marland Kitchen   EKG Interpretation Date/Time:  Friday August 18 2023 13:35:16 EDT Ventricular Rate:  67 PR Interval:  166 QRS Duration:  86 QT Interval:  420 QTC Calculation: 443 R Axis:   8  Text Interpretation: Normal sinus rhythm Normal ECG Confirmed by Lennie Odor [phone]) on 08/18/2023 1:49:46 PM   Physical Exam:   VS:  BP 118/84 (BP Location: Left Arm, Patient Position: Sitting, Cuff Size: Large)   Pulse 67   Ht 5\' 8"  (1.727 m)   Wt 233 lb (105.7 kg)   SpO2 94%   BMI 35.43 kg/m    Wt Readings from Last 3 Encounters:  08/18/23 233 lb (105.7 kg)  08/16/23 214 lb 15.2 oz (97.5 kg)  09/22/20 215 lb (97.5 kg)    GEN: Well nourished, well developed in no acute distress NECK: No JVD; No carotid bruits CARDIAC: RRR, no murmurs, rubs, gallops RESPIRATORY:  Clear to auscultation without rales, wheezing or rhonchi  ABDOMEN: Soft, non-tender, non-distended EXTREMITIES:  No edema; No deformity  ASSESSMENT AND PLAN: .   Assessment and Plan    Chest Pain Intermittent nocturnal chest pain with palpitations and dyspnea. Negative ER workup and normal EKG. Differential includes obstructive CAD and menopause-related symptoms. Strong family history of heart disease. Discussed coronary CTA for non-invasive coronary artery visualization and potential early cholesterol management. - Order coronary CT angiography (CTA) to exclude obstructive CAD. - Administer  100 mg of metoprolol tartrate two hours before the CT scan. - Review heart monitor results via MyChart.  Hypertension Hypertension managed with medication since August.   Palpitations Menopausal symptoms managed with paroxetine. Symptoms may contribute to nocturnal chest discomfort and palpitations. Reassured symptoms could be menopause-related, but further testing is warranted due to family history. - follow-up results of  monitor by PCP. TSH normal.   Follow-up Awaiting results from seven-day heart monitor. Recent thyroid testing and labs are normal. Coordination with primary care for comprehensive review of results. - Schedule follow-up based on testing results. - Coordinate CT scan scheduling with the hospital.        Follow-up: Return if symptoms worsen or fail to improve.   Signed, Lenna Gilford. Flora Lipps, MD, [facility]  [facility]  [address] [place], [place] [address] [phone]  2:29 PM

## 2023-08-18 ENCOUNTER — Ambulatory Visit: Attending: Cardiovascular Disease | Admitting: Cardiovascular Disease

## 2023-08-18 ENCOUNTER — Encounter: Payer: Self-pay | Admitting: Cardiovascular Disease

## 2023-08-18 VITALS — BP 118/84 | HR 67 | Ht 68.0 in | Wt 233.0 lb

## 2023-08-18 DIAGNOSIS — R079 Chest pain, unspecified: Secondary | ICD-10-CM

## 2023-08-18 DIAGNOSIS — R072 Precordial pain: Secondary | ICD-10-CM

## 2023-08-18 DIAGNOSIS — R0602 Shortness of breath: Secondary | ICD-10-CM | POA: Diagnosis not present

## 2023-08-18 DIAGNOSIS — R002 Palpitations: Secondary | ICD-10-CM | POA: Diagnosis not present

## 2023-08-18 MED ORDER — METOPROLOL TARTRATE 100 MG PO TABS: 100.0000 mg | ORAL_TABLET | Freq: Once | ORAL | 0 refills | Status: AC

## 2023-08-18 NOTE — Patient Instructions (Addendum)
 Medication Instructions:  Your physician recommends that you continue on your current medications as directed. Please refer to the Current Medication list given to you today.    *If you need a refill on your cardiac medications before your next appointment, please call your pharmacy*   Lab Work: None    If you have labs (blood work) drawn today and your tests are completely normal, you will receive your results only by: MyChart Message (if you have MyChart) OR A paper copy in the mail If you have any lab test that is abnormal or we need to change your treatment, we will call you to review the results.   Testing/Procedures:   Your cardiac CT will be scheduled at one of the below locations:   [facility] [address] [place], [place] [address] [phone]   If scheduled at [facility], please arrive at the [facility] and Children's Entrance (Entrance C2) of [facility] 30 minutes prior to test start time. You can use the FREE valet parking offered at entrance C (encouraged to control the heart rate for the test)  Proceed to the [facility] Radiology Department (first floor) to check-in and test prep.  All radiology patients and guests should use entrance C2 at [facility], accessed from [facility], even though the hospital's physical address listed is [address]     Please follow these instructions carefully (unless otherwise directed):  An IV will be required for this test and Nitroglycerin will be given.  Hold all erectile dysfunction medications at least 3 days (72 hrs) prior to test. (Ie viagra, cialis, sildenafil, tadalafil, etc)   On the Night Before the Test: Be sure to Drink plenty of water. Do not consume any caffeinated/decaffeinated beverages or chocolate 12 hours prior to your test. Do not take any antihistamines 12 hours prior to your test.   On the Day of the Test: Drink plenty of water until  1 hour prior to the test. Do not eat any food 1 hour prior to test. You may take your regular medications prior to the test.  Take metoprolol (Lopressor) 100 MG two hours prior to test. If you take Furosemide/Hydrochlorothiazide/Spironolactone/Chlorthalidone, please HOLD on the morning of the test. Patients who wear a continuous glucose monitor MUST remove the device prior to scanning. FEMALES- please wear underwire-free bra if available, avoid dresses & tight clothing      After the Test: Drink plenty of water. After receiving IV contrast, you may experience a mild flushed feeling. This is normal. On occasion, you may experience a mild rash up to 24 hours after the test. This is not dangerous. If this occurs, you can take Benadryl 25 mg, Zyrtec, Claritin, or Allegra and increase your fluid intake. (Patients taking Tikosyn should avoid Benadryl, and may take Zyrtec, Claritin, or Allegra) If you experience trouble breathing, this can be serious. If it is severe call 911 IMMEDIATELY. If it is mild, please call our office.  We will call to schedule your test 2-4 weeks out understanding that some insurance companies will need an authorization prior to the service being performed.   For more information and frequently asked questions, please visit our website : [email or web address]  For non-scheduling related questions, please contact the cardiac imaging nurse navigator should you have any questions/concerns: Cardiac Imaging Nurse Navigators Direct Office Dial: [phone]   For scheduling needs, including cancellations and rescheduling, please call [place], [phone].    Follow-Up: At [facility],  you and your health needs are our priority.  As part of our continuing mission to provide you with exceptional heart care, we have created designated Provider Care Teams.  These Care Teams include your primary Cardiologist (physician) and Advanced Practice Providers (APPs -   Physician Assistants and Nurse Practitioners) who all work together to provide you with the care you need, when you need it.  We recommend signing up for the patient portal called "MyChart".  Sign up information is provided on this After Visit Summary.  MyChart is used to connect with patients for Virtual Visits (Telemedicine).  Patients are able to view lab/test results, encounter notes, upcoming appointments, etc.  Non-urgent messages can be sent to your provider as well.   To learn more about what you can do with MyChart, go to [email or web address].    Your next appointment:   Follow up as needed pending results of cardiac testing   Provider:   Lennie Odor, MD     Other Instructions s

## 2023-08-31 ENCOUNTER — Telehealth (HOSPITAL_COMMUNITY): Payer: Self-pay | Admitting: *Deleted

## 2023-08-31 NOTE — Telephone Encounter (Signed)
 Reaching out to patient to offer assistance regarding upcoming cardiac imaging study; pt verbalizes understanding of appt date/time, parking situation and where to check in, pre-test NPO status and medications ordered, and verified current allergies; name and call back number provided for further questions should they arise Johney Frame RN Navigator Cardiac Imaging [facility] [phone] office [phone] cell

## 2023-09-01 ENCOUNTER — Ambulatory Visit (HOSPITAL_COMMUNITY)
Admission: RE | Admit: 2023-09-01 | Discharge: 2023-09-01 | Disposition: A | Discharge status: Home / Self Care | Source: Ambulatory Visit | Attending: Cardiovascular Disease | Admitting: Cardiovascular Disease

## 2023-09-01 DIAGNOSIS — R079 Chest pain, unspecified: Secondary | ICD-10-CM | POA: Diagnosis present

## 2023-09-01 MED ORDER — NITROGLYCERIN 0.4 MG SL SUBL
SUBLINGUAL_TABLET | SUBLINGUAL | Status: AC
  Filled 2023-09-01: qty 2

## 2023-09-01 MED ORDER — METOPROLOL TARTRATE 5 MG/5ML IV SOLN: 10.0000 mg | Freq: Once | INTRAVENOUS | Status: DC | PRN

## 2023-09-01 MED ORDER — DILTIAZEM HCL 25 MG/5ML IV SOLN: 10.0000 mg | INTRAVENOUS | Status: DC | PRN

## 2023-09-01 MED ORDER — IOHEXOL 350 MG/ML SOLN
95.0000 mL | Freq: Once | INTRAVENOUS | Status: AC | PRN
  Administered 2023-09-01: 95 mL via INTRAVENOUS

## 2023-09-01 MED ORDER — NITROGLYCERIN 0.4 MG SL SUBL
0.8000 mg | SUBLINGUAL_TABLET | Freq: Once | SUBLINGUAL | Status: AC
  Administered 2023-09-01: 0.8 mg via SUBLINGUAL

## 2023-12-26 ENCOUNTER — Other Ambulatory Visit: Payer: Self-pay | Admitting: Cardiovascular Disease

## 2024-01-16 ENCOUNTER — Other Ambulatory Visit: Payer: Self-pay | Admitting: Physician Assistant

## 2024-01-16 DIAGNOSIS — Z1231 Encounter for screening mammogram for malignant neoplasm of breast: Secondary | ICD-10-CM

## 2024-01-17 IMAGING — MG MM DIGITAL SCREENING BILAT W/ TOMO AND CAD
8 series · 8 of 24 positions shown · non-contrast
Comparison: Previous exam(s).

CLINICAL DATA: Screening.

EXAM:
DIGITAL SCREENING BILATERAL MAMMOGRAM WITH TOMOSYNTHESIS AND CAD
TECHNIQUE: Bilateral screening digital craniocaudal and mediolateral oblique
mammograms were obtained. Bilateral screening digital breast
tomosynthesis was performed. The images were evaluated with
computer-aided detection.

[L MLO synth-2D]
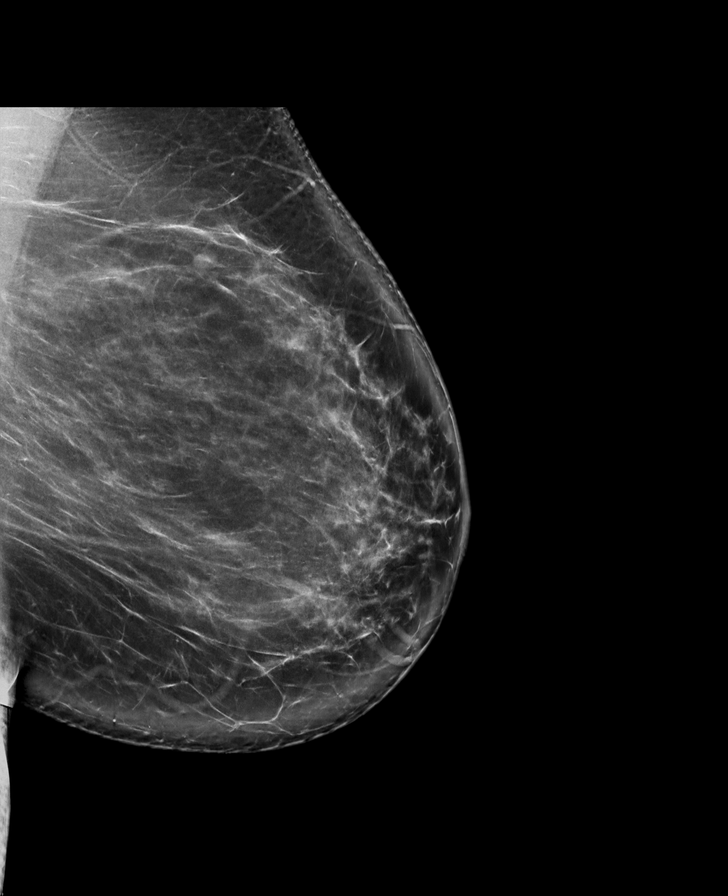

[R MLO synth-2D]
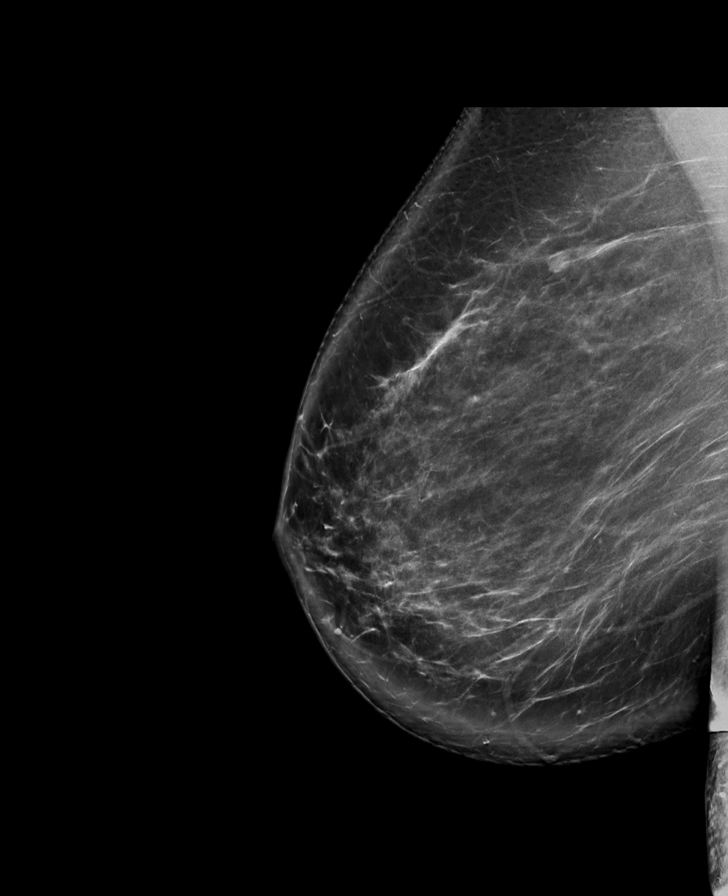

[L CC synth-2D]
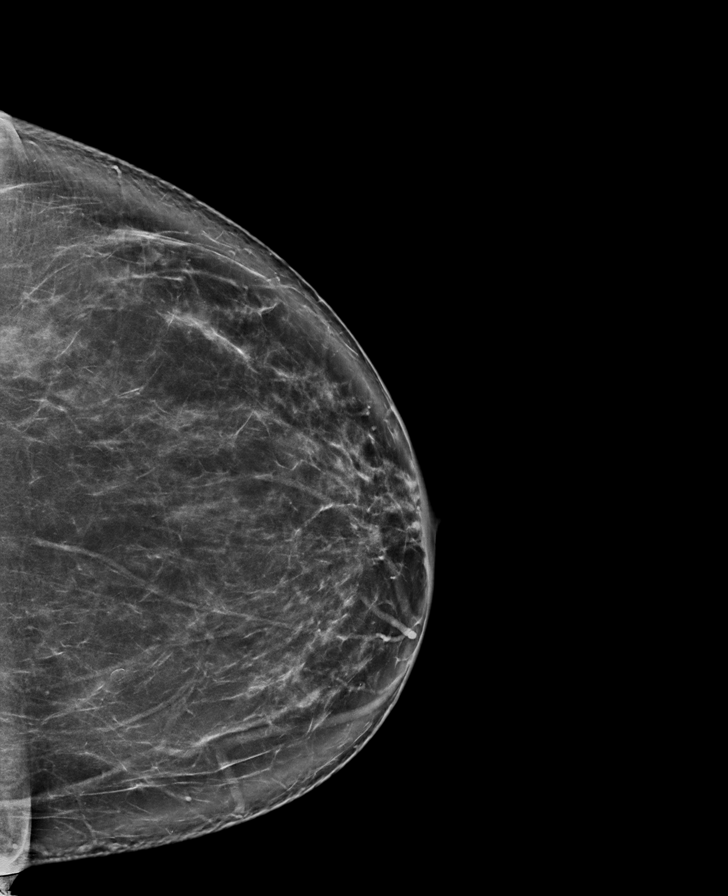

[R CC synth-2D]
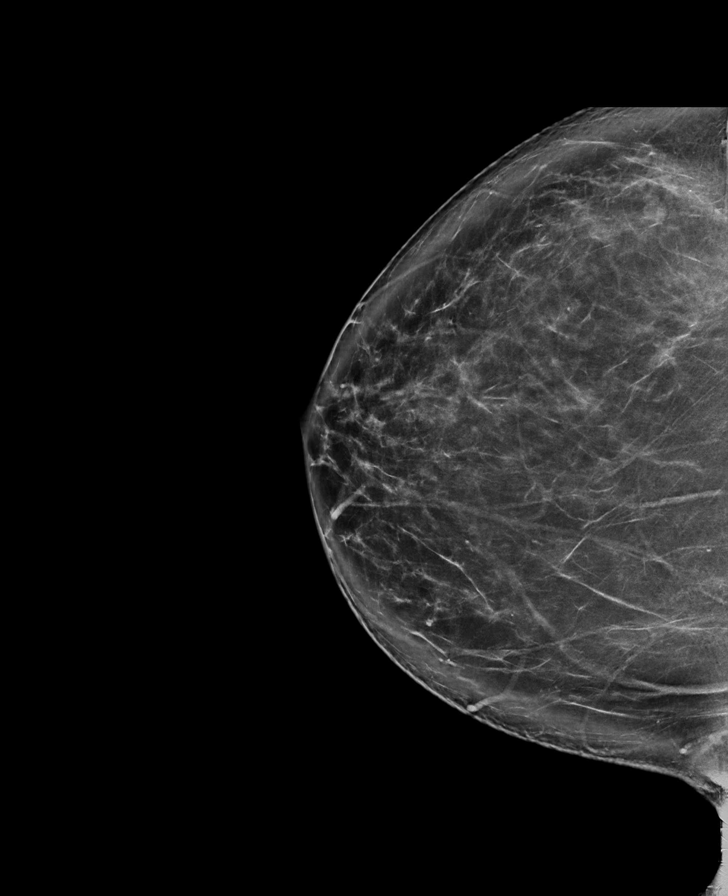

[R MLO tomo · tomo slice 57/113.0]
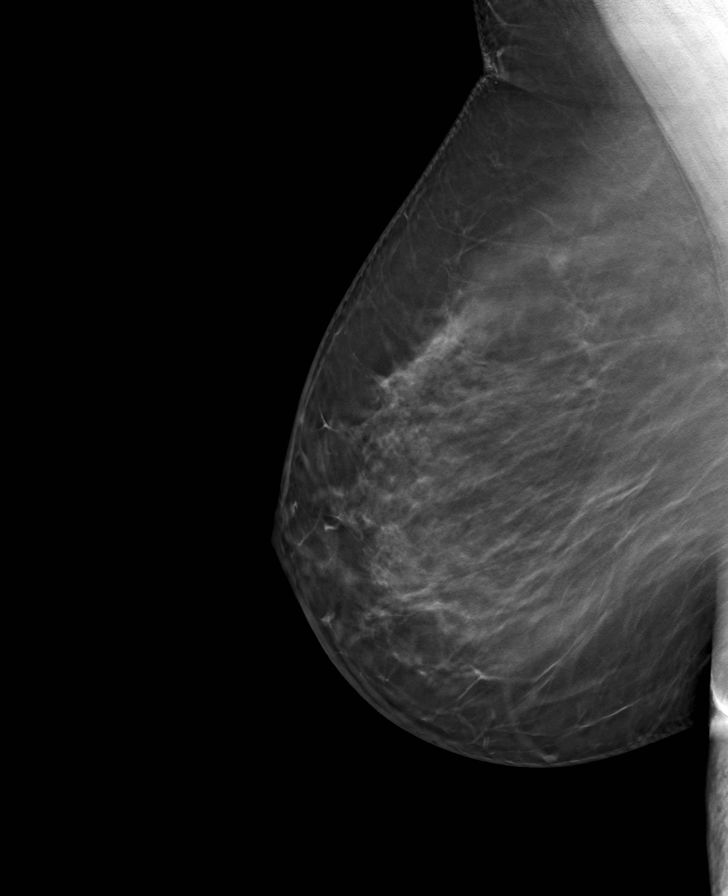

[L MLO tomo · tomo slice 56/111.0]
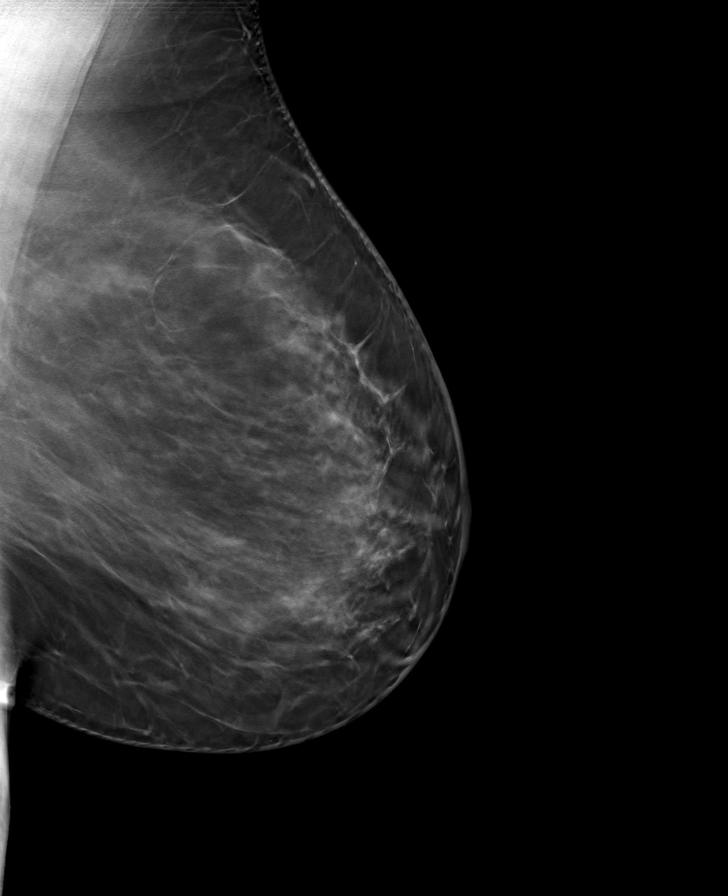

[R CC tomo · tomo slice 53/105.0]
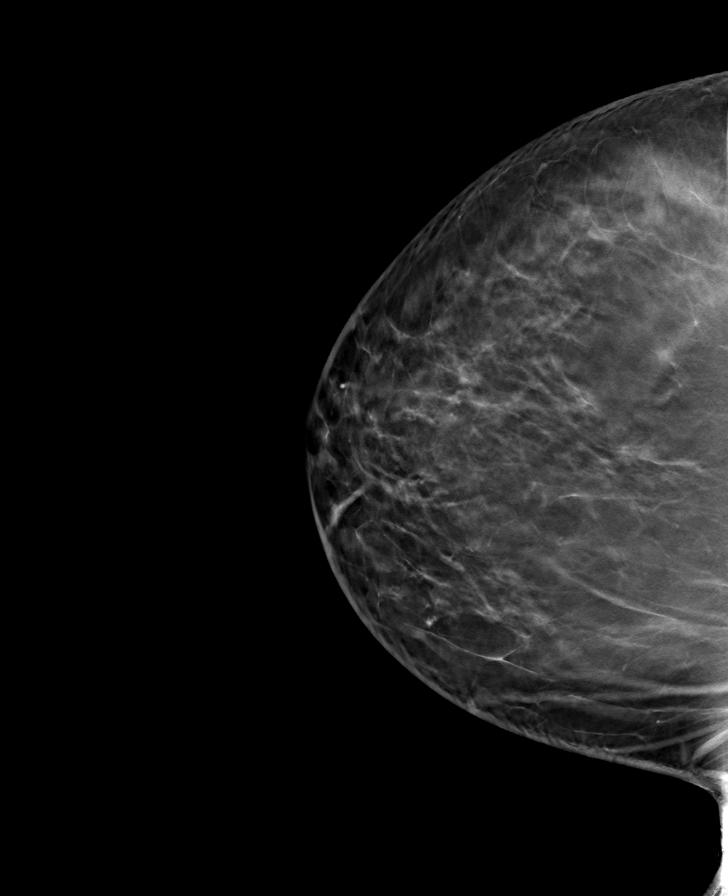

[L CC tomo · tomo slice 51/102.0]
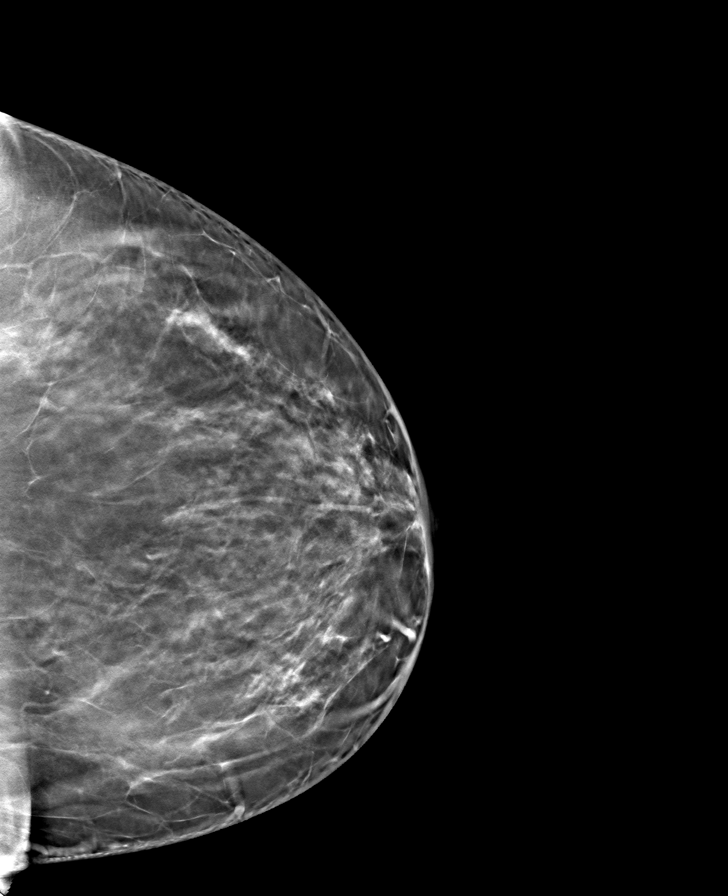

[8 of 24 positions shown; findings below may reference images not displayed]

ACR Breast Density Category b: There are scattered areas of
fibroglandular density.
FINDINGS: There are no findings suspicious for malignancy.
IMPRESSION: No mammographic evidence of malignancy. A result letter of this
screening mammogram will be mailed directly to the patient.

RECOMMENDATION:
Screening mammogram in one year. (Code:51-O-LD2)

BI-RADS CATEGORY  1: Negative.

## 2024-02-13 ENCOUNTER — Ambulatory Visit
Admission: RE | Admit: 2024-02-13 | Discharge: 2024-02-13 | Disposition: A | Discharge status: Home / Self Care | Source: Ambulatory Visit | Attending: Physician Assistant | Admitting: Physician Assistant

## 2024-02-13 DIAGNOSIS — Z1231 Encounter for screening mammogram for malignant neoplasm of breast: Secondary | ICD-10-CM
